# Patient Record
Sex: Female | Born: 1970 | Race: White | Hispanic: No | Smoking: Never smoker
Health system: Southern US, Community
[De-identification: ages and names within clinical notes are randomized; demographics above are authoritative.]

## PROBLEM LIST (undated history)

## (undated) DIAGNOSIS — I739 Peripheral vascular disease, unspecified: Secondary | ICD-10-CM

## (undated) DIAGNOSIS — R51 Headache: Secondary | ICD-10-CM

## (undated) DIAGNOSIS — R519 Headache, unspecified: Secondary | ICD-10-CM

## (undated) DIAGNOSIS — F419 Anxiety disorder, unspecified: Secondary | ICD-10-CM

## (undated) DIAGNOSIS — M1990 Unspecified osteoarthritis, unspecified site: Secondary | ICD-10-CM

## (undated) DIAGNOSIS — C50919 Malignant neoplasm of unspecified site of unspecified female breast: Secondary | ICD-10-CM

## (undated) DIAGNOSIS — E063 Autoimmune thyroiditis: Secondary | ICD-10-CM

## (undated) DIAGNOSIS — C801 Malignant (primary) neoplasm, unspecified: Secondary | ICD-10-CM

## (undated) DIAGNOSIS — J45909 Unspecified asthma, uncomplicated: Secondary | ICD-10-CM

## (undated) DIAGNOSIS — E559 Vitamin D deficiency, unspecified: Secondary | ICD-10-CM

## (undated) DIAGNOSIS — J189 Pneumonia, unspecified organism: Secondary | ICD-10-CM

## (undated) DIAGNOSIS — F40298 Other specified phobia: Secondary | ICD-10-CM

## (undated) DIAGNOSIS — I639 Cerebral infarction, unspecified: Secondary | ICD-10-CM

## (undated) HISTORY — DX: Anxiety disorder, unspecified: F41.9

## (undated) HISTORY — DX: Autoimmune thyroiditis: E06.3

## (undated) HISTORY — DX: Vitamin D deficiency, unspecified: E55.9

## (undated) HISTORY — DX: Other specified phobia: F40.298

## (undated) HISTORY — PX: VEIN LIGATION AND STRIPPING: SHX2653

---

## 1992-10-01 HISTORY — PX: TUBAL LIGATION: SHX77

## 1995-10-02 HISTORY — PX: ABDOMINAL HYSTERECTOMY: SHX81

## 2002-11-18 ENCOUNTER — Other Ambulatory Visit: Admission: RE | Admit: 2002-11-18 | Discharge: 2002-11-18 | Payer: Self-pay | Admitting: Obstetrics and Gynecology

## 2004-08-31 ENCOUNTER — Inpatient Hospital Stay (HOSPITAL_COMMUNITY): Admission: RE | Admit: 2004-08-31 | Discharge: 2004-09-03 | Payer: Self-pay | Admitting: Psychiatry

## 2004-08-31 ENCOUNTER — Ambulatory Visit: Payer: Self-pay | Admitting: Psychiatry

## 2004-10-01 DIAGNOSIS — J189 Pneumonia, unspecified organism: Secondary | ICD-10-CM

## 2004-10-01 HISTORY — DX: Pneumonia, unspecified organism: J18.9

## 2006-10-01 DIAGNOSIS — I639 Cerebral infarction, unspecified: Secondary | ICD-10-CM

## 2006-10-01 HISTORY — DX: Cerebral infarction, unspecified: I63.9

## 2014-05-25 ENCOUNTER — Other Ambulatory Visit: Payer: Self-pay | Admitting: Family Medicine

## 2014-05-25 DIAGNOSIS — N6310 Unspecified lump in the right breast, unspecified quadrant: Secondary | ICD-10-CM

## 2014-05-25 DIAGNOSIS — R922 Inconclusive mammogram: Secondary | ICD-10-CM

## 2014-05-28 ENCOUNTER — Other Ambulatory Visit: Payer: Self-pay | Admitting: Family Medicine

## 2014-05-28 ENCOUNTER — Ambulatory Visit
Admission: RE | Admit: 2014-05-28 | Discharge: 2014-05-28 | Disposition: A | Discharge status: Home / Self Care | Payer: BC Managed Care – PPO | Source: Ambulatory Visit | Attending: Family Medicine | Admitting: Family Medicine

## 2014-05-28 ENCOUNTER — Encounter (INDEPENDENT_AMBULATORY_CARE_PROVIDER_SITE_OTHER): Payer: Self-pay

## 2014-05-28 DIAGNOSIS — R922 Inconclusive mammogram: Secondary | ICD-10-CM

## 2014-05-28 DIAGNOSIS — N6310 Unspecified lump in the right breast, unspecified quadrant: Secondary | ICD-10-CM

## 2014-06-04 ENCOUNTER — Ambulatory Visit
Admission: RE | Admit: 2014-06-04 | Discharge: 2014-06-04 | Disposition: A | Discharge status: Home / Self Care | Payer: BC Managed Care – PPO | Source: Ambulatory Visit | Attending: Family Medicine | Admitting: Family Medicine

## 2014-06-04 DIAGNOSIS — R922 Inconclusive mammogram: Secondary | ICD-10-CM

## 2014-06-04 DIAGNOSIS — N6310 Unspecified lump in the right breast, unspecified quadrant: Secondary | ICD-10-CM

## 2014-06-08 ENCOUNTER — Other Ambulatory Visit: Payer: Self-pay | Admitting: Family Medicine

## 2014-06-08 DIAGNOSIS — C50911 Malignant neoplasm of unspecified site of right female breast: Secondary | ICD-10-CM

## 2014-06-10 ENCOUNTER — Telehealth: Payer: Self-pay | Admitting: *Deleted

## 2014-06-10 DIAGNOSIS — C50411 Malignant neoplasm of upper-outer quadrant of right female breast: Secondary | ICD-10-CM

## 2014-06-10 DIAGNOSIS — Z170 Estrogen receptor positive status [ER+]: Secondary | ICD-10-CM

## 2014-06-10 NOTE — Telephone Encounter (Signed)
Confirmed [facility] for 06/16/14 at 0800.  Instructions and contact information given.

## 2014-06-11 ENCOUNTER — Ambulatory Visit
Admission: RE | Admit: 2014-06-11 | Discharge: 2014-06-11 | Disposition: A | Discharge status: Home / Self Care | Payer: BC Managed Care – PPO | Source: Ambulatory Visit | Attending: Family Medicine | Admitting: Family Medicine

## 2014-06-11 DIAGNOSIS — C50911 Malignant neoplasm of unspecified site of right female breast: Secondary | ICD-10-CM

## 2014-06-11 MED ORDER — GADOBENATE DIMEGLUMINE 529 MG/ML IV SOLN
12.0000 mL | Freq: Once | INTRAVENOUS | Status: AC | PRN
  Administered 2014-06-11: 12 mL via INTRAVENOUS

## 2014-06-16 ENCOUNTER — Encounter: Payer: Self-pay | Admitting: Oncology

## 2014-06-16 ENCOUNTER — Telehealth: Payer: Self-pay | Admitting: Oncology

## 2014-06-16 ENCOUNTER — Ambulatory Visit
Admission: RE | Admit: 2014-06-16 | Discharge: 2014-06-16 | Disposition: A | Discharge status: Home / Self Care | Payer: BC Managed Care – PPO | Source: Ambulatory Visit | Attending: Radiation Oncology | Admitting: Radiation Oncology

## 2014-06-16 ENCOUNTER — Ambulatory Visit (HOSPITAL_BASED_OUTPATIENT_CLINIC_OR_DEPARTMENT_OTHER): Payer: BC Managed Care – PPO | Admitting: Oncology

## 2014-06-16 ENCOUNTER — Encounter: Payer: Self-pay | Admitting: *Deleted

## 2014-06-16 ENCOUNTER — Encounter: Payer: Self-pay | Admitting: Dietician

## 2014-06-16 ENCOUNTER — Ambulatory Visit: Payer: BC Managed Care – PPO | Attending: General Surgery | Admitting: Physical Therapy

## 2014-06-16 ENCOUNTER — Other Ambulatory Visit (HOSPITAL_BASED_OUTPATIENT_CLINIC_OR_DEPARTMENT_OTHER): Payer: BC Managed Care – PPO

## 2014-06-16 ENCOUNTER — Other Ambulatory Visit (INDEPENDENT_AMBULATORY_CARE_PROVIDER_SITE_OTHER): Payer: Self-pay | Admitting: General Surgery

## 2014-06-16 ENCOUNTER — Ambulatory Visit: Payer: BC Managed Care – PPO

## 2014-06-16 VITALS — BP 112/71 | HR 66 | Temp 98.3°F | Resp 20 | Ht 65.0 in | Wt 138.6 lb

## 2014-06-16 DIAGNOSIS — Z8673 Personal history of transient ischemic attack (TIA), and cerebral infarction without residual deficits: Secondary | ICD-10-CM

## 2014-06-16 DIAGNOSIS — C50419 Malignant neoplasm of upper-outer quadrant of unspecified female breast: Secondary | ICD-10-CM

## 2014-06-16 DIAGNOSIS — C50911 Malignant neoplasm of unspecified site of right female breast: Secondary | ICD-10-CM

## 2014-06-16 DIAGNOSIS — IMO0001 Reserved for inherently not codable concepts without codable children: Secondary | ICD-10-CM | POA: Diagnosis not present

## 2014-06-16 DIAGNOSIS — C50411 Malignant neoplasm of upper-outer quadrant of right female breast: Secondary | ICD-10-CM

## 2014-06-16 DIAGNOSIS — C50919 Malignant neoplasm of unspecified site of unspecified female breast: Secondary | ICD-10-CM | POA: Insufficient documentation

## 2014-06-16 DIAGNOSIS — E063 Autoimmune thyroiditis: Secondary | ICD-10-CM

## 2014-06-16 DIAGNOSIS — Z90710 Acquired absence of both cervix and uterus: Secondary | ICD-10-CM | POA: Insufficient documentation

## 2014-06-16 DIAGNOSIS — Z170 Estrogen receptor positive status [ER+]: Secondary | ICD-10-CM

## 2014-06-16 LAB — COMPREHENSIVE METABOLIC PANEL (CC13)
ALBUMIN: 4 g/dL (ref 3.5–5.0)
ALT: 14 U/L (ref 0–55)
ANION GAP: 10 meq/L (ref 3–11)
AST: 19 U/L (ref 5–34)
Alkaline Phosphatase: 63 U/L (ref 40–150)
BUN: 8 mg/dL (ref 7.0–26.0)
CALCIUM: 9 mg/dL (ref 8.4–10.4)
CO2: 25 meq/L (ref 22–29)
Chloride: 106 mEq/L (ref 98–109)
Creatinine: 0.8 mg/dL (ref 0.6–1.1)
GLUCOSE: 95 mg/dL (ref 70–140)
POTASSIUM: 4 meq/L (ref 3.5–5.1)
SODIUM: 141 meq/L (ref 136–145)
TOTAL PROTEIN: 7.3 g/dL (ref 6.4–8.3)
Total Bilirubin: 0.62 mg/dL (ref 0.20–1.20)

## 2014-06-16 LAB — CBC WITH DIFFERENTIAL/PLATELET
BASO%: 1.2 % (ref 0.0–2.0)
BASOS ABS: 0 10*3/uL (ref 0.0–0.1)
EOS ABS: 0.3 10*3/uL (ref 0.0–0.5)
EOS%: 9 % — ABNORMAL HIGH (ref 0.0–7.0)
HCT: 41.6 % (ref 34.8–46.6)
HGB: 14 g/dL (ref 11.6–15.9)
LYMPH%: 26 % (ref 14.0–49.7)
MCH: 30.6 pg (ref 25.1–34.0)
MCHC: 33.6 g/dL (ref 31.5–36.0)
MCV: 91 fL (ref 79.5–101.0)
MONO#: 0.4 10*3/uL (ref 0.1–0.9)
MONO%: 11.2 % (ref 0.0–14.0)
NEUT%: 52.6 % (ref 38.4–76.8)
NEUTROS ABS: 2 10*3/uL (ref 1.5–6.5)
PLATELETS: 177 10*3/uL (ref 145–400)
RBC: 4.57 10*6/uL (ref 3.70–5.45)
RDW: 12.1 % (ref 11.2–14.5)
WBC: 3.7 10*3/uL — ABNORMAL LOW (ref 3.9–10.3)
lymph#: 1 10*3/uL (ref 0.9–3.3)

## 2014-06-16 NOTE — Progress Notes (Signed)
[facility]  Telephone:([phone]) [phone] [phone]) [phone]     ID: Celese Banner DOB: [birth year]  MR#: [id number]  QXI#:[id number]  Patient Care Team: Kathyrn Lass, MD as PCP - General (Family Medicine) Excell Seltzer, MD as Consulting Physician (General Surgery) Chauncey Cruel, MD as Consulting Physician (Oncology) Thea Silversmith, MD as Consulting Physician (Radiation Oncology)  CHIEF COMPLAINT: Estrogen receptor positive breast cancer  CURRENT TREATMENT: Awaiting definitive surgery   BREAST CANCER HISTORY: "Candy" noted a change in her right breast sometime around February or March 2015. She waited to see whether it would resolve, but as it did not she eventually brought it to the attention of Dr. Sabra Heck, who confirmed the finding and set up the patient for bilateral diagnostic mammography and right breast ultrasonography at the [facility] 05/28/2014. Breast density was category D. There was an irregular mass with spiculations in the right breast upper-outer quadrant which was palpable and described as hard. Ultrasound confirmed an irregular spiculated hypoechoic mass occupying the entire upper outer quadrant of the right breast and measuring at least 4.5 cm. In the right axilla there was a lymph node which was normal in size but showed cortical nodularity and abnormal peripheral hypervascularity.  Accordingly on 06/04/2014 the patient underwent biopsy of the breast mass (the lymph node described from the prior ultrasound was found to have a large fatty hilum and symmetric cortex but a moderate size artery and vein draped over it and therefore it was not biopsied). 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  On 06/11/2014 the patient underwent bilateral breast MRI. This showed an area of architectural distortion an irregular clumped enhancement extending for a total of 7.4 cm in the right breast. He comes close to the skin of the anterior lateral right breast but no definite abnormal skin enhancement was noted. There were no findings suggestive of nipple involvement and no findings of the pectoralis involvement. The left breast and axillary lymph nodes are unremarkable. However there was a nonenhancing T2 increased signal along the left aspect of the lower thoracic vertebral body which was felt to require further evaluation.  The patient's subsequent history is as detailed below  INTERVAL HISTORY: Can be was evaluated in the multidisciplinary breast cancer clinic 06/16/2014 accompanied by her mother. Her case was also presented at the multidisciplinary breast cancer conference that same morning.  REVIEW OF SYSTEMS: Aside from the mass itself, candy has been experiencing a variety of symptoms which are disturbing to her. She tells me that she had been on Wellbutrin and Adderall at some point and was found to have "mini strokes" possibly secondary to those medications. She has numbness in her hands and feet, which is intermittent but fairly constant. This can be accompanied by tingling. She has severe migraine headaches, which "got better after my divorce" in 1999, but not occur at least once or twice a month and recently more frequently. Of course she has a history of asthma and stress urinary incontinence. She has a history of Hashimoto's thyroiditis which is currently not active. She sleeps poorly, has pain in her right breast, knees, chest, and in her right this to her lower molar. She complains of blurred vision. She is having some hearing loss. She has sinus problems. She sleeps  on 2 pillows. She bruises easily. She has some difficulty walking and complaints of leg weakness, which is very  intermittent. She feels forgetful and anxious. She has significant phobic concerns. A detailed review of systems today was otherwise stable  PAST MEDICAL HISTORY: Past Medical History  Diagnosis Date  . Hashimoto's thyroiditis   . Vitamin D deficiency   . Anxiety     PAST SURGICAL HISTORY: Past Surgical History  Procedure Laterality Date  . Abdominal hysterectomy    . Tubal ligation    . Vein ligation and stripping      FAMILY HISTORY No family history on file. The patient's father died from complications of pulmonary fibrosis at the age of 45. The patient's mother is living, age 55. The patient had one brother, no sisters. The patient's paternal grandmother had ovarian cancer. The patient's father had 3 sisters. 2 of them have been diagnosed with breast cancer, one in her 5s one in her 17s. The one in her 10s was tested for the BRCA genes and was negative.  GYNECOLOGIC HISTORY:  No LMP recorded. Menarche age 9, first live birth age 6. The patient is GX P2. She had a hysterectomy in 1997. She is not on hormone replacement.  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 She is divorced. At home she lives with her mother, Vauda Salvucci, who semiretired. She has worked in Scientist, research (medical). The patient's son Kimball Manske is currently 51 attends and she stated where he is a Ship broker. The patient's other son died at the age of 79. The patient has no grandchildren. She is not a Ambulance person.    ADVANCED DIRECTIVES: Not in place. At her 06/16/2014 visit the patient was given the appropriate documents to complete. She is considering naming her brother as her healthcare power of attorney.   HEALTH MAINTENANCE: History  Substance Use Topics  . Smoking status: Never Smoker   . Smokeless tobacco: Not on file  . Alcohol Use: No     Colonoscopy:  PAP:  Bone density:  Lipid panel:  Allergies not on file  No current  outpatient prescriptions on file.   No current facility-administered medications for this visit.    OBJECTIVE: Middle-aged white woman in no acute distress Filed Vitals:   06/16/14 0831  BP: 112/71  Pulse: 66  Temp: 98.3 F (36.8 C)  Resp: 20     Body mass index is 23.06 kg/(m^2).    ECOG FS:1 - Symptomatic but completely ambulatory  Ocular: Sclerae unicteric, pupils equal, round and reactive to light Ear-nose-throat: Oropharynx clear, dentition in fair repair Lymphatic: No cervical or supraclavicular adenopathy Lungs no rales or rhonchi, no wheezes  Heart regular rate and rhythm, no murmur appreciated Abd soft, nontender, positive bowel sounds MSK no focal spinal tenderness, no joint edema Neuro: non-focal, well-oriented, appropriate affect Breasts: The mass in the right breast is easily palpable. It is movable. By palpation it measures approximately 4 cm. There is no evidence of skin or nipple involvement. I do not palpate any right axillary masses. The left breast is unremarkable.   LAB RESULTS:  CMP     Component Value Date/Time   NA 141 06/16/2014 0810   K 4.0 06/16/2014 0810   CO2 25 06/16/2014 0810   GLUCOSE 95 06/16/2014 0810   BUN 8.0 06/16/2014 0810   CREATININE 0.8 06/16/2014 0810   CALCIUM 9.0 06/16/2014 0810   PROT 7.3 06/16/2014 0810  ALBUMIN 4.0 06/16/2014 0810   AST 19 06/16/2014 0810   ALT 14 06/16/2014 0810   ALKPHOS 63 06/16/2014 0810   BILITOT 0.62 06/16/2014 0810    I No results found for this basename: SPEP, UPEP,  kappa and lambda light chains    Lab Results  Component Value Date   WBC 3.7* 06/16/2014   NEUTROABS 2.0 06/16/2014   HGB 14.0 06/16/2014   HCT 41.6 06/16/2014   MCV 91.0 06/16/2014   PLT 177 06/16/2014      Chemistry      Component Value Date/Time   NA 141 06/16/2014 0810   K 4.0 06/16/2014 0810   CO2 25 06/16/2014 0810   BUN 8.0 06/16/2014 0810   CREATININE 0.8 06/16/2014 0810      Component Value Date/Time   CALCIUM 9.0 06/16/2014 0810    ALKPHOS 63 06/16/2014 0810   AST 19 06/16/2014 0810   ALT 14 06/16/2014 0810   BILITOT 0.62 06/16/2014 0810       No results found for this basename: LABCA2    No components found with this basename: LABCA125    No results found for this basename: INR,  in the last 168 hours  Urinalysis No results found for this basename: colorurine, appearanceur, labspec, phurine, glucoseu, hgbur, bilirubinur, ketonesur, proteinur, urobilinogen, nitrite, leukocytesur    STUDIES: Mr Breast Bilateral W Wo Contrast  06/11/2014   CLINICAL DATA:  43 year old female with recently diagnosed invasive mammary carcinoma of the right breast post ultrasound-guided biopsy of an irregular palpable mass at 10 o'clock.  LABS:  Not applicable.  EXAM: BILATERAL BREAST MRI WITH AND WITHOUT CONTRAST  TECHNIQUE: Multiplanar, multisequence MR images of both breasts were obtained prior to and following the intravenous administration of 53m of MultiHance.  THREE-DIMENSIONAL MR IMAGE RENDERING ON INDEPENDENT WORKSTATION:  Three-dimensional MR images were rendered by post-processing of the original MR data on an independent workstation. The three-dimensional MR images were interpreted, and findings are reported in the following complete MRI report for this study. Three dimensional images were evaluated at the independent DynaCad workstation  COMPARISON:  Previous exams  FINDINGS: Breast composition: c:  Heterogeneous fibroglandular tissue  Background parenchymal enhancement: Marked. Several small oval T2 bright nonenhancing masses are seen in the bilateral breasts compatible with small cysts. Motion is present on the post contrast images.  Right breast: Biopsy clip artifact is present in the upper-outer right breast at 10 o'clock at site of known biopsy proven malignancy with associated architectural distortion and an irregular linear clumped enhancing mass extending adjacent and posterior to the clip measuring approximately 7.4 cm AP,  2.8 cm transverse, and approximately 3 cm craniocaudal. Abnormal enhancement comes in close proximity to the skin of the anterior lateral right breast, although no definite abnormal skin enhancement seen. No findings to suggest nipple involvement. No findings of pectoralis muscle involvement.  Left breast: No suspicious rapidly enhancing masses or abnormal areas of enhancement in the left breast to suggest malignancy.  Lymph nodes: No morphologically abnormal axillary lymph nodes. No internal mammary lymphadenopathy seen.  Ancillary findings: Nonenhancing ovoid increased T2 signal along the peripheral left aspect of a lower thoracic vertebral body, indeterminate.  IMPRESSION: 1. Biopsy proven malignant the in the upper-outer right breast measures up to 7.4 cm AP. This comes within close proximity of the skin of the anterior lateral right breast, although no abnormal skin enhancement is seen.  2.  No MRI evidence of malignancy in the left breast.  3. Nonenhancing increased signal  involving a lower thoracic vertebral body is indeterminate/ incompletely evaluated on this exam. Consider further evaluation with a bone scan to evaluate for osseous metastatic disease.  RECOMMENDATION: Treatment plan for known right breast malignancy.  BI-RADS CATEGORY  6: Known biopsy-proven malignancy.   Electronically Signed   By: Everlean Alstrom M.D.   On: 06/11/2014 13:11   Mm Digital Diagnostic Bilat  05/28/2014   CLINICAL DATA:  Patient reports a questioned palpable mass right upper outer quadrant for 6 months  EXAM: DIGITAL DIAGNOSTIC  bilateral MAMMOGRAM WITH CAD  ULTRASOUND right BREAST  COMPARISON:  This is the patient's baseline exam  ACR Breast Density Category d: The breast tissue is extremely dense, which lowers the sensitivity of mammography.  FINDINGS: There is an irregular mass with peripheral spiculations and overlying skin indentation in the right upper outer quadrant, corresponding to the screening mammographic  finding. No abnormality is identified in the left breast.  Mammographic images were processed with CAD.  On physical exam, I palpate a hard mass in the right breast upper outer quadrant.  Ultrasound is performed, showing an irregular spiculated hypoechoic shadowing mass occupying nearly the entire upper-outer quadrant of the right breast 10 o'clock location 3 cm from the nipple, extending subjacent to the right nipple, measuring 4.5 by 3.8 by 1.3 cm. This corresponds to the palpable mass. In the right axilla, there is a lymph node which is normal in size but demonstrates cortical nodularity and abnormal peripheral hypervascularity.  IMPRESSION: Highly suspicious right breast mass 10 o'clock location with abnormal right axillary lymph node cortical nodularity and hypervascularity. Ultrasound-guided core biopsy of the dominant right breast mass and right axillary lymph node is recommended and has been scheduled 06/04/2014.  RECOMMENDATION: Right breast and axilla ultrasound-guided core biopsy  I have discussed the findings and recommendations with the patient. Results were also provided in writing at the conclusion of the visit. If applicable, a reminder letter will be sent to the patient regarding the next appointment.  BI-RADS CATEGORY  5: Highly suggestive of malignancy.   Electronically Signed   By: Conchita Paris M.D.   On: 05/28/2014 15:07   Mm Digital Diagnostic Unilat R  06/04/2014   CLINICAL DATA:  43 year old female -evaluate clip placement following ultrasound-guided right breast biopsy.  EXAM: DIAGNOSTIC RIGHT MAMMOGRAM POST ULTRASOUND BIOPSY  COMPARISON:  05/28/2014 and prior mammograms  FINDINGS: Mammographic images were obtained following ultrasound guided biopsy of 4.5 cm mass in the upper-outer right breast.  The coil shaped clip is in satisfactory position.  IMPRESSION: Satisfactory clip position following ultrasound-guided right breast biopsy.  Final Assessment: Post Procedure Mammograms for  Marker Placement   Electronically Signed   By: Hassan Rowan M.D.   On: 06/04/2014 14:15   US Breast Ltd Uni Right Inc Axilla  05/28/2014   CLINICAL DATA:  Patient reports a questioned palpable mass right upper outer quadrant for 6 months  EXAM: DIGITAL DIAGNOSTIC  bilateral MAMMOGRAM WITH CAD  ULTRASOUND right BREAST  COMPARISON:  This is the patient's baseline exam  ACR Breast Density Category d: The breast tissue is extremely dense, which lowers the sensitivity of mammography.  FINDINGS: There is an irregular mass with peripheral spiculations and overlying skin indentation in the right upper outer quadrant, corresponding to the screening mammographic finding. No abnormality is identified in the left breast.  Mammographic images were processed with CAD.  On physical exam, I palpate a hard mass in the right breast upper outer quadrant.  Ultrasound is performed, showing  an irregular spiculated hypoechoic shadowing mass occupying nearly the entire upper-outer quadrant of the right breast 10 o'clock location 3 cm from the nipple, extending subjacent to the right nipple, measuring 4.5 by 3.8 by 1.3 cm. This corresponds to the palpable mass. In the right axilla, there is a lymph node which is normal in size but demonstrates cortical nodularity and abnormal peripheral hypervascularity.  IMPRESSION: Highly suspicious right breast mass 10 o'clock location with abnormal right axillary lymph node cortical nodularity and hypervascularity. Ultrasound-guided core biopsy of the dominant right breast mass and right axillary lymph node is recommended and has been scheduled 06/04/2014.  RECOMMENDATION: Right breast and axilla ultrasound-guided core biopsy  I have discussed the findings and recommendations with the patient. Results were also provided in writing at the conclusion of the visit. If applicable, a reminder letter will be sent to the patient regarding the next appointment.  BI-RADS CATEGORY  5: Highly suggestive of  malignancy.   Electronically Signed   By: Conchita Paris M.D.   On: 05/28/2014 15:07   [place] Rt Breast Bx W Loc Dev 1st Lesion Img Bx Spec US Guide  06/08/2014   ADDENDUM REPORT: 06/08/2014 15:11  ADDENDUM: Pathology: Invasive mammary carcinoma, mammary carcinoma in situ  Pathology concordance with imaging findings: Yes  Recommendation: The patient is scheduled to have an MRI on 06/11/2014. She will be seen in multidisciplinary clinic on 06/16/2014.  At the request of the patient, I spoke with her by telephone on 06/08/2014 at 13:30. She reports that the biopsy site is healing well.   Electronically Signed   By: Pamelia Hoit M.D.   On: 06/08/2014 15:11   06/08/2014   CLINICAL DATA:  43 year old female for tissue sampling of right breast mass.  EXAM: ULTRASOUND GUIDED RIGHT BREAST CORE NEEDLE BIOPSY  COMPARISON:  Previous exams.  FINDINGS: Ultrasound evaluation again demonstrates the irregular hypoechoic mass at the 10 o'clock position of the right breast.  Several normal appearing right axillary lymph nodes are identified. The questioned lymph node on the prior report has a large fatty hilum and relatively symmetric cortex measuring up to 2 mm but also has a moderate sized artery and vein draped over this node. Given the fact that this lymph node does not appear particularly suspicious and high likelihood of bleeding risk, the right axillary lymph node biopsy was not performed.  I met with the patient and we discussed the procedure of ultrasound-guided biopsy, including benefits and alternatives. We discussed the high likelihood of a successful procedure. We discussed the risks of the procedure, including infection, bleeding, tissue injury, clip migration, and inadequate sampling. Informed written consent was given. The usual time-out protocol was performed immediately prior to the procedure.  Using sterile technique and 2% Lidocaine as local anesthetic, under direct ultrasound visualization, a 12 gauge spring-loaded  device was used to perform biopsy of the irregular 4.5 cm mass at the 10 o'clock position of the right breast using a lateral approach. At the conclusion of the procedure a coil shaped tissue marker clip was deployed into the biopsy cavity. Follow up 2 view mammogram was performed and dictated separately.  IMPRESSION: Ultrasound guided biopsy of right breast mass. No apparent complications.  Please note that the right lymph node biopsy was not performed as discussed above.  Pathology will be followed.  Electronically Signed: By: Hassan Rowan M.D. On: 06/04/2014 14:14    ASSESSMENT: 43 y.o. [place] woman status post right breast upper-outer quadrant biopsy 06/04/2014 for a clinical T3  N0-1, stage II/III invasive ductal carcinoma, grade 2, estrogen receptor 91% positive, progesterone receptor 58% positive, with an MIB-1 of 9%, and no HER-2 amplification  (1) genetics testing pending  (2) Oncotype results to be obtained if node negative at surgery  PLAN: We spent the better part of today's hour-long appointment discussing the biology of breast cancer in general, and the specifics of the patient's tumor in particular. Candy understands the difference between local treatment and systemic treatment. As far as local treatment is concerned, we feel it will be very difficult for her to avoid a mastectomy on the right. It is not clear at this point whether she will want bilateral mastectomies regardless of heart genetics results, but she does need to be tested for the BRCA gene mutations. She understands that even if she is BRCA positive it is possible to keep the contralateral breast, by doing that only yearly mammography but yearly MRIs. However most patients in that situation do opt for bilateral mastectomies because of the significant risk of a new breast cancer developing in the contralateral breast.  The patient understands she will need postmastectomy radiation. At this point she is not expressed an interest  in reconstruction  The systemic therapy questions are also complex. Certainly she will benefit from antiestrogen, and that is the most effective therapy for her cancer. She will get no benefit from an anti-HER-2 treatment.  The chemotherapy question is more complex. We do not see enlarged lymph nodes on the MRI, but her chance of having a positive lymph node and sentinel lymph node biopsy is done is very high because of the size of her tumor. If she does have positive lymph nodes, then she will need chemotherapy. If the lymph nodes are negative however we will send an Oncotype. She understands that may tell us that she will not benefit from chemotherapy or that she absolutely needs chemotherapy. If the results fall in the intermediate range my bias in this young woman would be to proceed to chemotherapy but that will require longer discussion.  She does need to be completely staged and I am setting her up for a brain MRI as well as a PET scan. She is going to see me in approximately 5 weeks. By then we should have all the data necessary to make the chemotherapy decision.  The patient has a good understanding of the overall plan. She agrees with it. She knows the goal of treatment in her case is cure. She will call with any problems that may develop before her next visit here.  Chauncey Cruel, MD   06/16/2014 9:53 AM

## 2014-06-16 NOTE — Telephone Encounter (Signed)
PER POF TO [facility] PT APPT-aNEE DID OVERRIDE FOR 60 MIN APPT 11/5-pt here for [facility] will give pt copy of sch

## 2014-06-16 NOTE — Progress Notes (Unsigned)
Patient was seen by RD during [facility] on 06/16/2014  Provided pt with folder of educational materials regarding general nutrition recommendations for breast cancer patients, plant-based diets, antioxidants, cancer facts vs myths, and information on organic foods  Explained importance of healthy nutrition during treatments and encouraged pt to consume daily recommended amount of fruits and vegetables, emphasizing variety of intake for maximum antioxidant and synergistic health benefits. Promoted adequate fiber intake, with use of whole grain and whole wheat products, beans, and lentils. Encouraged patient to follow a low fat diet with use of heart healthy fats, and to opt for plant-based proteins weekly  Recommended pt maintain healthy weight during treatments, and encouraged gradual weight loss as warranted after procedures.  Diet recall indicated pt consumes largely vegan diet, with the exception of tuna and fish intake. Takes Cat's Claw for immune support. Pt reported to have done a lot of nutrition research after her diagnosis and was appreciative of additional information. Noted her appetite has decreased slightly recently; however pt attributes this to her recent decreased physical activity levels  Patient had questions regarding soy and breast cancer. Pt does enjoy eating tofu and consuming soy milk, but had decreased intake with her diagnosis as she was weary of the interactions. Reviewed soy and breast cancer nutrition handout, and recommended a conservative intake of 1-2 servings daily to assist pt in meeting protein needs  Expect excellent compliance. Pt has great interest and is very aware and compliant with appropriate diet recommendations.  Provided pt with outpatient oncology RD contact information. Encouraged pt to contact RD with additional follow up questions or nutrition-related concerns.  Atlee Abide MS RD LDN Clinical Dietitian [phone]

## 2014-06-16 NOTE — Addendum Note (Signed)
Addended by: Rockwell Germany on: 06/16/2014 10:38 AM   Modules accepted: Orders

## 2014-06-16 NOTE — Progress Notes (Signed)
Checked in new pt with no financial concerns at this time.  I informed pt of the [employer] and gave her a flyer to show what they assist with.  I also informed pt of the different foundations that offer copay assistance w/ chemo if needed and I will also obtain authorization for chemo if req.  She has my card for any future questions or concerns and also if she would like to apply for the [employer].

## 2014-06-16 NOTE — Progress Notes (Signed)
I saw the patient today. She has a palpable large mass in the right breast measuring 7.4 cm by MRI with negative lymph nodes.  There are also findings concerning for metastatic diase.  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  She is interested in mastectomy. We discussed post mastectomy radiation for tumors > 5 cm after mastectomy.  I will meet back with her to discuss radiation in greater detail .

## 2014-06-16 NOTE — Progress Notes (Signed)
[place] Psychosocial Distress Screening Clinical Social Work  Patient completed distress screening protocol and scored a 7 on the Psychosocial Distress Thermometer which indicates moderate distress. Clinical Social Worker met with patient and patients mother in [facility] to assess for distress and other psychosocial needs.  While patient did state she was still experiencing some anxiety associated with her diagnosis, her level of distress had decreased after meeting with the treatment team.  CSW offered patient support and discussed the importance of emotional support during treatment.  CSW provided patient with information on the support team and support services at [facility].  Patient expressed interest in young womens support group and was agreeable to an [employer] referral.  CSW provided contact information and encouraged patient to call with any questions or concerns.         Clinical Social Worker follow up needed: Yes.    If yes, follow up plan: [place] [facility] Referral   [place] 06/16/2014  Screening Type Initial Screening  Mark the number that describes how much distress you have been experiencing in the past week 7  Practical problem type Insurance;Work/school  Emotional problem type Nervousness/Anxiety;Adjusting to illness  Spiritual/Religous concerns type Facing my mortality  Physical Problem type Pain;Getting around;Tingling hands/feet  Physician notified of physical symptoms Yes  Referral to clinical psychology No  Referral to clinical social work Yes  Referral to dietition No  Referral to financial advocate No  Referral to support programs Yes  Referral to palliative care No   Johnnye Lana, MSW, LCSW, OSW-C Clinical Social Worker [place] [phone]

## 2014-06-17 ENCOUNTER — Other Ambulatory Visit: Payer: BC Managed Care – PPO

## 2014-06-17 ENCOUNTER — Ambulatory Visit (HOSPITAL_BASED_OUTPATIENT_CLINIC_OR_DEPARTMENT_OTHER): Payer: BC Managed Care – PPO | Admitting: Genetic Counselor

## 2014-06-17 ENCOUNTER — Encounter: Payer: Self-pay | Admitting: *Deleted

## 2014-06-17 DIAGNOSIS — IMO0002 Reserved for concepts with insufficient information to code with codable children: Secondary | ICD-10-CM

## 2014-06-17 DIAGNOSIS — C50919 Malignant neoplasm of unspecified site of unspecified female breast: Secondary | ICD-10-CM | POA: Insufficient documentation

## 2014-06-17 DIAGNOSIS — Z801 Family history of malignant neoplasm of trachea, bronchus and lung: Secondary | ICD-10-CM

## 2014-06-17 DIAGNOSIS — Z8041 Family history of malignant neoplasm of ovary: Secondary | ICD-10-CM

## 2014-06-17 DIAGNOSIS — C50419 Malignant neoplasm of upper-outer quadrant of unspecified female breast: Secondary | ICD-10-CM

## 2014-06-17 DIAGNOSIS — Z803 Family history of malignant neoplasm of breast: Secondary | ICD-10-CM

## 2014-06-17 NOTE — Progress Notes (Signed)
HISTORY OF PRESENT ILLNESS: Dr. Excell Seltzer requested a cancer genetics consultation for [facility], a 43 y.o. female, due to a personal and family history of cancer.  Ms. Pol presents to clinic today to discuss the possibility of a hereditary predisposition to cancer, genetic testing, and to further clarify her future cancer risks, as well as potential cancer risk for family members. Ms. Leiner was recently diagnosed with right breast cancer at the age of 25 and is in the process of planning treatment. She would like to use genetic test results for surgical treatment decisions. She has had a TAH but both ovaries remain intact. She has no history of other cancer.   Past Medical History  Diagnosis Date   Hashimoto's thyroiditis    Vitamin D deficiency    Anxiety    Other isolated or specific phobias     Past Surgical History  Procedure Laterality Date   Abdominal hysterectomy     Tubal ligation     Vein ligation and stripping     History   Social History   Marital Status: Divorced    Spouse Name: N/A    Number of Children: N/A   Years of Education: N/A   Social History Main Topics   Smoking status: Never Smoker    Smokeless tobacco: Not on file   Alcohol Use: No   Drug Use: No   Sexual Activity: Not on file   Other Topics Concern   Not on file   Social History Narrative   No narrative on file     FAMILY HISTORY:  During the visit, a 4-generation pedigree was obtained. Significant diagnoses include the following:  Family History  Problem Relation Age of Onset   Breast cancer Paternal Aunt 59   Ovarian cancer Paternal Grandmother 26   Lung cancer Paternal Grandfather 68   Breast cancer Paternal Aunt 60    reportedly BRCA negative.     Ms. Mira ancestry is of Caucasian descent. There is no known Jewish ancestry or consanguinity.  GENETIC COUNSELING ASSESSMENT: Ms. Figler is a 43 y.o. female with a personal and family history of cancer suggestive  of a hereditary predisposition to cancer. We, therefore, discussed and recommended the following at today's visit.   DISCUSSION: We reviewed the characteristics, features and inheritance patterns of hereditary cancer syndromes. We also discussed genetic testing, including the appropriate family members to test, the process of testing, insurance coverage and turn-around-time for results. We discussed the implications of a negative, positive and/or variant of uncertain significant result. We recommended Ms. Capshaw pursue genetic testing for the BRCA1 and BRCA2 genes. If this testing is negative, we recommended testing for the OvaNext gene panel.   PLAN: Based on our above recommendation, Ms. Santerre wished to pursue genetic testing and the blood sample was drawn and will be sent to [facility] for analysis. Results for the BRCA1 and BRCA2 genes should be available within approximately 2 weeks time, at which point they will be disclosed by telephone to Ms. Danko, as will any additional recommendations warranted by these results. If necessary for the gene panel to then be pursued, these results will take an additional 2 weeks and will be disclosed once available.We also encouraged Ms. Fermin to remain in contact with cancer genetics annually so that we can continuously update the family history and inform her of any changes in cancer genetics and testing that may be of benefit for this family. Ms.  Corredor questions were answered to her satisfaction  today. Our contact information was provided should additional questions or concerns arise.   Thank you for the referral and allowing [place] to share in the care of your patient.   The patient was seen for a total of 40 minutes in face-to-face genetic counseling.  This patient was discussed with Magrinat who agrees with the above.    _______________________________________________________________________ For Office Staff:  Number of people involved in  session: 2 Was an Intern/ student involved with case: not applicable

## 2014-06-17 NOTE — Progress Notes (Signed)
[place] Social Work  Clinical Social Work was referred by pt  to review and complete healthcare advance directives.  Clinical Social Worker met with patient and her mother in [place] office.  The patient designated Taylor Perez as their primary healthcare agent and Charon Smedberg as their secondary agent.  Patient also completed healthcare living will.    Clinical Social Worker notarized documents and made copies for patient/family. Clinical Social Worker will send documents to medical records to be scanned into patient's chart. Clinical Social Worker encouraged patient/family to contact with any additional questions or concerns.  Loren Racer, [place] Worker [place]  [place] Phone: [phone] Fax: [phone]

## 2014-06-19 ENCOUNTER — Encounter (HOSPITAL_COMMUNITY): Payer: Self-pay

## 2014-06-19 ENCOUNTER — Encounter (HOSPITAL_COMMUNITY)
Admission: RE | Admit: 2014-06-19 | Discharge: 2014-06-19 | Disposition: A | Discharge status: Home / Self Care | Payer: BC Managed Care – PPO | Source: Ambulatory Visit | Attending: Oncology | Admitting: Oncology

## 2014-06-19 DIAGNOSIS — C50419 Malignant neoplasm of upper-outer quadrant of unspecified female breast: Secondary | ICD-10-CM | POA: Diagnosis present

## 2014-06-19 DIAGNOSIS — C50411 Malignant neoplasm of upper-outer quadrant of right female breast: Secondary | ICD-10-CM

## 2014-06-19 LAB — GLUCOSE, CAPILLARY: GLUCOSE-CAPILLARY: 85 mg/dL (ref 70–99)

## 2014-06-19 MED ORDER — FLUDEOXYGLUCOSE F - 18 (FDG) INJECTION: 7.5000 | Freq: Once | INTRAVENOUS | Status: AC | PRN

## 2014-06-21 ENCOUNTER — Telehealth: Payer: Self-pay | Admitting: *Deleted

## 2014-06-21 ENCOUNTER — Other Ambulatory Visit: Payer: Self-pay | Admitting: Oncology

## 2014-06-21 NOTE — Telephone Encounter (Signed)
Spoke with patient from [facility] 06/16/14.  She informed me that she went for her PET scan on 06/19/14. When her test was complete the technologist asked her is she had an appointment to see Dr. Jana Hakim on Monday and she told him now she will see him in about 4 weeks after surgery.  She states he then told her then she should go home and have a cocktail.  Patient was very upset and she had feelings before this that her cancer had spread to her brain and other areas.  Informed patient I would report this to Dr. Jana Hakim and to [facility].  Safety Zone Portal completed.  I informed her that as soon as the PET scan results were ready I would have Dr. Jana Hakim to review and give her a call.  Offered support and encouraged her to call with any needs.

## 2014-06-25 ENCOUNTER — Ambulatory Visit (HOSPITAL_COMMUNITY)
Admission: RE | Admit: 2014-06-25 | Discharge: 2014-06-25 | Disposition: A | Discharge status: Home / Self Care | Payer: BC Managed Care – PPO | Source: Ambulatory Visit | Attending: Oncology | Admitting: Oncology

## 2014-06-25 DIAGNOSIS — C50419 Malignant neoplasm of upper-outer quadrant of unspecified female breast: Secondary | ICD-10-CM | POA: Diagnosis not present

## 2014-06-25 DIAGNOSIS — R51 Headache: Secondary | ICD-10-CM | POA: Insufficient documentation

## 2014-06-25 DIAGNOSIS — C50411 Malignant neoplasm of upper-outer quadrant of right female breast: Secondary | ICD-10-CM

## 2014-06-25 MED ORDER — GADOBENATE DIMEGLUMINE 529 MG/ML IV SOLN
12.0000 mL | Freq: Once | INTRAVENOUS | Status: AC | PRN
  Administered 2014-06-25: 12 mL via INTRAVENOUS

## 2014-06-30 ENCOUNTER — Encounter: Payer: Self-pay | Admitting: Genetic Counselor

## 2014-06-30 DIAGNOSIS — Z8041 Family history of malignant neoplasm of ovary: Secondary | ICD-10-CM

## 2014-06-30 DIAGNOSIS — C50919 Malignant neoplasm of unspecified site of unspecified female breast: Secondary | ICD-10-CM

## 2014-06-30 DIAGNOSIS — Z803 Family history of malignant neoplasm of breast: Secondary | ICD-10-CM

## 2014-06-30 NOTE — Progress Notes (Signed)
Taylor Perez recently had cancer genetic counseling at [facility] on June 17, 2014. At that time, it was recommended she pursue genetic testing. Her BRCA1 and BRCA2 gene test, which was performed at [facility], has returned and is negative for mutations. These results were disclosed to her today. Per her request, reflex testing for the OvaNext gene panel at [facility] was initiated. Results for the gene panel should be available in 2-3 more weeks and we will contact her to discuss these results and recommendations warranted by these results.

## 2014-07-01 ENCOUNTER — Other Ambulatory Visit: Payer: Self-pay | Admitting: Oncology

## 2014-07-01 ENCOUNTER — Encounter (HOSPITAL_COMMUNITY): Payer: Self-pay

## 2014-07-07 NOTE — Pre-Procedure Instructions (Signed)
Taylor Perez  07/07/2014   Your procedure is scheduled on:  Friday October 16 th at 1100 AM  Report to [facility] Admitting at 0900 AM.  Call this number if you have problems the morning of surgery: [phone]   Remember:   Do not eat food or drink liquids after midnight.   Take these medicines the morning of surgery with A SIP OF WATER: Use and bring inhaler day of surgery.  Stop all vitamins,minerals, herbal medications, Aspirin, Krill oil, and Nsaids 5 days prior to surgery.    Do not wear jewelry, make-up or nail polish.  Do not wear lotions, powders, or perfumes. You may wear deodorant.  Do not shave 48 hours prior to surgery.   Do not bring valuables to the hospital.  [facility] is not responsible  for any belongings or valuables.               Contacts, dentures or bridgework may not be worn into surgery.  Leave suitcase in the car. After surgery it may be brought to your room.  For patients admitted to the hospital, discharge time is determined by your treatment team.               Patients discharged the day of surgery will not be allowed to drive home.    Special Instructions: [place] - Preparing for Surgery  Before surgery, you can play an important role.  Because skin is not sterile, your skin needs to be as free of germs as possible.  You can reduce the number of germs on you skin by washing with CHG (chlorahexidine gluconate) soap before surgery.  CHG is an antiseptic cleaner which kills germs and bonds with the skin to continue killing germs even after washing.  Please DO NOT use if you have an allergy to CHG or antibacterial soaps.  If your skin becomes reddened/irritated stop using the CHG and inform your nurse when you arrive at Short Stay.  Do not shave (including legs and underarms) for at least 48 hours prior to the first CHG shower.  You may shave your face.  Please follow these instructions carefully:   1.  Shower with CHG Soap the night  before surgery and the                                morning of Surgery.  2.  If you choose to wash your hair, wash your hair first as usual with your       normal shampoo.  3.  After you shampoo, rinse your hair and body thoroughly to remove the                      Shampoo.  4.  Use CHG as you would any other liquid soap.  You can apply chg directly       to the skin and wash gently with scrungie or a clean washcloth.  5.  Apply the CHG Soap to your body ONLY FROM THE NECK DOWN.        Do not use on open wounds or open sores.  Avoid contact with your eyes,       ears, mouth and genitals (private parts).  Wash genitals (private parts)       with your normal soap.  6.  Wash thoroughly, paying special attention to the area where your surgery  will be performed.  7.  Thoroughly rinse your body with warm water from the neck down.  8.  DO NOT shower/wash with your normal soap after using and rinsing off       the CHG Soap.  9.  Pat yourself dry with a clean towel.            10.  Wear clean pajamas.            11.  Place clean sheets on your bed the night of your first shower and do not        sleep with pets.  Day of Surgery  Do not apply any lotions/deoderants the morning of surgery.  Please wear clean clothes to the hospital/surgery center.      Please read over the following fact sheets that you were given: Pain Booklet, Coughing and Deep Breathing and Surgical Site Infection Prevention

## 2014-07-08 ENCOUNTER — Encounter (HOSPITAL_COMMUNITY): Payer: Self-pay

## 2014-07-08 ENCOUNTER — Telehealth: Payer: Self-pay | Admitting: *Deleted

## 2014-07-08 ENCOUNTER — Encounter (HOSPITAL_COMMUNITY)
Admission: RE | Admit: 2014-07-08 | Discharge: 2014-07-08 | Disposition: A | Discharge status: Home / Self Care | Payer: BC Managed Care – PPO | Source: Ambulatory Visit | Attending: General Surgery | Admitting: General Surgery

## 2014-07-08 ENCOUNTER — Ambulatory Visit (HOSPITAL_COMMUNITY)
Admission: RE | Admit: 2014-07-08 | Discharge: 2014-07-08 | Disposition: A | Discharge status: Home / Self Care | Payer: BC Managed Care – PPO | Source: Ambulatory Visit | Attending: Anesthesiology | Admitting: Anesthesiology

## 2014-07-08 DIAGNOSIS — Z01818 Encounter for other preprocedural examination: Secondary | ICD-10-CM | POA: Diagnosis not present

## 2014-07-08 DIAGNOSIS — R001 Bradycardia, unspecified: Secondary | ICD-10-CM | POA: Insufficient documentation

## 2014-07-08 DIAGNOSIS — I739 Peripheral vascular disease, unspecified: Secondary | ICD-10-CM | POA: Diagnosis not present

## 2014-07-08 DIAGNOSIS — C50411 Malignant neoplasm of upper-outer quadrant of right female breast: Secondary | ICD-10-CM | POA: Insufficient documentation

## 2014-07-08 DIAGNOSIS — Z8673 Personal history of transient ischemic attack (TIA), and cerebral infarction without residual deficits: Secondary | ICD-10-CM | POA: Insufficient documentation

## 2014-07-08 DIAGNOSIS — Z9011 Acquired absence of right breast and nipple: Secondary | ICD-10-CM | POA: Insufficient documentation

## 2014-07-08 HISTORY — DX: Headache: R51

## 2014-07-08 HISTORY — DX: Peripheral vascular disease, unspecified: I73.9

## 2014-07-08 HISTORY — DX: Headache, unspecified: R51.9

## 2014-07-08 HISTORY — DX: Unspecified osteoarthritis, unspecified site: M19.90

## 2014-07-08 HISTORY — DX: Cerebral infarction, unspecified: I63.9

## 2014-07-08 HISTORY — DX: Pneumonia, unspecified organism: J18.9

## 2014-07-08 HISTORY — DX: Unspecified asthma, uncomplicated: J45.909

## 2014-07-08 HISTORY — DX: Malignant (primary) neoplasm, unspecified: C80.1

## 2014-07-08 LAB — BASIC METABOLIC PANEL
Anion gap: 9 (ref 5–15)
BUN: 8 mg/dL (ref 6–23)
CO2: 25 mEq/L (ref 19–32)
CREATININE: 0.8 mg/dL (ref 0.50–1.10)
Calcium: 9 mg/dL (ref 8.4–10.5)
Chloride: 106 mEq/L (ref 96–112)
GFR calc Af Amer: >90 mL/min (ref >90)
GFR calc non Af Amer: 88 mL/min — ABNORMAL LOW (ref >90)
GLUCOSE: 88 mg/dL (ref 70–99)
Potassium: 4.4 mEq/L (ref 3.7–5.3)
SODIUM: 140 meq/L (ref 137–147)

## 2014-07-08 LAB — CBC
HEMATOCRIT: 40 % (ref 36.0–46.0)
Hemoglobin: 13.8 g/dL (ref 12.0–15.0)
MCH: 30.3 pg (ref 26.0–34.0)
MCHC: 34.5 g/dL (ref 30.0–36.0)
MCV: 87.7 fL (ref 78.0–100.0)
Platelets: 169 10*3/uL (ref 150–400)
RBC: 4.56 MIL/uL (ref 3.87–5.11)
RDW: 11.9 % (ref 11.5–15.5)
WBC: 2.7 10*3/uL — AB (ref 4.0–10.5)

## 2014-07-08 MED ORDER — CHLORHEXIDINE GLUCONATE 4 % EX LIQD: 1.0000 "application " | Freq: Once | CUTANEOUS | Status: DC

## 2014-07-08 NOTE — Telephone Encounter (Signed)
Per Dr. Jana Hakim, call results of MRI to patient: no progression seen on scan. Patient voices understanding and appreciated call.

## 2014-07-09 NOTE — Progress Notes (Signed)
Anesthesia Chart Review:  Pt is 43 year old female scheduled for R total mastectomy with R axillary sentinel lymph node biopsy, possible axillary dissection on 07/16/14 with Dr. Excell Seltzer.   PMH: Stroke ("mini stroke" 2008), Hashimoto's thyroiditis, PVD, asthma, breast cancer, anxiety  Medications include: albuterol, vitamins.   Preoperative labs reviewed.  WBC 2.7.   Chest x-ray reviewed. No active cardiopulmonary disease.  EKG: Sinus bradycardia Possible Left atrial enlargement Cannot rule out Anterior infarct , age undetermined No previous tracing  No CV symptoms documented in PAT. If no changes, I anticipate pt can proceed with surgery as scheduled.   Willeen Cass, FNP-BC [facility] Short Stay Surgical Center/Anesthesiology Phone: [phone] 07/09/2014 2:43 PM

## 2014-07-14 ENCOUNTER — Other Ambulatory Visit (INDEPENDENT_AMBULATORY_CARE_PROVIDER_SITE_OTHER): Payer: Self-pay | Admitting: General Surgery

## 2014-07-15 MED ORDER — CEFAZOLIN SODIUM-DEXTROSE 2-3 GM-% IV SOLR
2.0000 g | INTRAVENOUS | Status: AC
  Administered 2014-07-16: 2 g via INTRAVENOUS
  Filled 2014-07-15: qty 50

## 2014-07-15 NOTE — Progress Notes (Signed)
Pt verbalize understanding arrival time 8am on 10/16

## 2014-07-16 ENCOUNTER — Encounter (HOSPITAL_COMMUNITY): Payer: BC Managed Care – PPO | Admitting: Emergency Medicine

## 2014-07-16 ENCOUNTER — Ambulatory Visit (HOSPITAL_COMMUNITY): Payer: BC Managed Care – PPO | Admitting: Certified Registered Nurse Anesthetist

## 2014-07-16 ENCOUNTER — Encounter (HOSPITAL_COMMUNITY)
Admission: RE | Admit: 2014-07-16 | Discharge: 2014-07-16 | Disposition: A | Discharge status: Home / Self Care | Payer: BC Managed Care – PPO | Source: Ambulatory Visit | Attending: General Surgery | Admitting: General Surgery

## 2014-07-16 ENCOUNTER — Encounter (HOSPITAL_COMMUNITY)
Admission: RE | Disposition: A | Discharge status: Home / Self Care | Payer: Self-pay | Source: Ambulatory Visit | Attending: General Surgery

## 2014-07-16 ENCOUNTER — Encounter (HOSPITAL_COMMUNITY): Payer: Self-pay | Admitting: *Deleted

## 2014-07-16 ENCOUNTER — Observation Stay (HOSPITAL_COMMUNITY)
Admission: RE | Admit: 2014-07-16 | Discharge: 2014-07-17 | Disposition: A | Discharge status: Home / Self Care | Payer: BC Managed Care – PPO | Source: Ambulatory Visit | Attending: General Surgery | Admitting: General Surgery

## 2014-07-16 DIAGNOSIS — Z170 Estrogen receptor positive status [ER+]: Secondary | ICD-10-CM | POA: Insufficient documentation

## 2014-07-16 DIAGNOSIS — Z4001 Encounter for prophylactic removal of breast: Secondary | ICD-10-CM | POA: Insufficient documentation

## 2014-07-16 DIAGNOSIS — F419 Anxiety disorder, unspecified: Secondary | ICD-10-CM | POA: Diagnosis not present

## 2014-07-16 DIAGNOSIS — C50411 Malignant neoplasm of upper-outer quadrant of right female breast: Principal | ICD-10-CM | POA: Insufficient documentation

## 2014-07-16 DIAGNOSIS — J45909 Unspecified asthma, uncomplicated: Secondary | ICD-10-CM | POA: Diagnosis not present

## 2014-07-16 DIAGNOSIS — C50919 Malignant neoplasm of unspecified site of unspecified female breast: Secondary | ICD-10-CM | POA: Diagnosis present

## 2014-07-16 DIAGNOSIS — C50911 Malignant neoplasm of unspecified site of right female breast: Secondary | ICD-10-CM

## 2014-07-16 DIAGNOSIS — I739 Peripheral vascular disease, unspecified: Secondary | ICD-10-CM | POA: Insufficient documentation

## 2014-07-16 HISTORY — PX: BILATERAL TOTAL MASTECTOMY WITH AXILLARY LYMPH NODE DISSECTION: SHX6364

## 2014-07-16 HISTORY — PX: TOTAL MASTECTOMY: SHX6129

## 2014-07-16 SURGERY — BILATERAL TOTAL MASTECTOMY WITH AXILLARY LYMPH NODE DISSECTION
Anesthesia: General | Site: Breast | Laterality: Bilateral

## 2014-07-16 MED ORDER — GLYCOPYRROLATE 0.2 MG/ML IJ SOLN
INTRAMUSCULAR | Status: AC
  Filled 2014-07-16: qty 3

## 2014-07-16 MED ORDER — OXYCODONE HCL 5 MG/5ML PO SOLN: 5.0000 mg | Freq: Once | ORAL | Status: DC | PRN

## 2014-07-16 MED ORDER — METHYLENE BLUE 1 % INJ SOLN
INTRAMUSCULAR | Status: AC
  Filled 2014-07-16: qty 10

## 2014-07-16 MED ORDER — CHLORHEXIDINE GLUCONATE 4 % EX LIQD: 1.0000 "application " | Freq: Once | CUTANEOUS | Status: DC

## 2014-07-16 MED ORDER — ONDANSETRON HCL 4 MG/2ML IJ SOLN
INTRAMUSCULAR | Status: AC
  Filled 2014-07-16: qty 2

## 2014-07-16 MED ORDER — DIPHENHYDRAMINE HCL 50 MG/ML IJ SOLN
INTRAMUSCULAR | Status: DC | PRN
  Administered 2014-07-16: 12.5 mg via INTRAVENOUS

## 2014-07-16 MED ORDER — FENTANYL CITRATE 0.05 MG/ML IJ SOLN
INTRAMUSCULAR | Status: AC
  Filled 2014-07-16: qty 5

## 2014-07-16 MED ORDER — SODIUM CHLORIDE 0.9 % IJ SOLN
INTRAMUSCULAR | Status: DC | PRN
  Administered 2014-07-16: 11:00:00 via INTRAMUSCULAR

## 2014-07-16 MED ORDER — FENTANYL CITRATE 0.05 MG/ML IJ SOLN
INTRAMUSCULAR | Status: AC
  Administered 2014-07-16: 100 ug via INTRAVENOUS
  Filled 2014-07-16: qty 2

## 2014-07-16 MED ORDER — ONDANSETRON HCL 4 MG/2ML IJ SOLN
INTRAMUSCULAR | Status: DC | PRN
  Administered 2014-07-16: 4 mg via INTRAVENOUS

## 2014-07-16 MED ORDER — PROMETHAZINE HCL 25 MG/ML IJ SOLN: 6.2500 mg | INTRAMUSCULAR | Status: DC | PRN

## 2014-07-16 MED ORDER — KCL IN DEXTROSE-NACL 20-5-0.9 MEQ/L-%-% IV SOLN
INTRAVENOUS | Status: DC
  Administered 2014-07-16 – 2014-07-17 (×2): via INTRAVENOUS
  Filled 2014-07-16 (×3): qty 1000

## 2014-07-16 MED ORDER — MIDAZOLAM HCL 5 MG/ML IJ SOLN: 2.0000 mg | Freq: Once | INTRAMUSCULAR | Status: DC

## 2014-07-16 MED ORDER — NEOSTIGMINE METHYLSULFATE 10 MG/10ML IV SOLN
INTRAVENOUS | Status: AC
  Filled 2014-07-16: qty 1

## 2014-07-16 MED ORDER — LACTATED RINGERS IV SOLN
INTRAVENOUS | Status: DC | PRN
  Administered 2014-07-16 (×2): via INTRAVENOUS

## 2014-07-16 MED ORDER — PROPOFOL 10 MG/ML IV BOLUS
INTRAVENOUS | Status: DC | PRN
  Administered 2014-07-16: 170 mg via INTRAVENOUS

## 2014-07-16 MED ORDER — SODIUM CHLORIDE 0.9 % IJ SOLN
INTRAMUSCULAR | Status: AC
  Filled 2014-07-16: qty 10

## 2014-07-16 MED ORDER — DIPHENHYDRAMINE HCL 50 MG/ML IJ SOLN
INTRAMUSCULAR | Status: AC
  Filled 2014-07-16: qty 1

## 2014-07-16 MED ORDER — LACTATED RINGERS IV SOLN
INTRAVENOUS | Status: DC
  Administered 2014-07-16: 09:00:00 via INTRAVENOUS

## 2014-07-16 MED ORDER — SCOPOLAMINE 1 MG/3DAYS TD PT72
MEDICATED_PATCH | TRANSDERMAL | Status: AC
  Administered 2014-07-16: 1.5 mg via TRANSDERMAL
  Filled 2014-07-16: qty 1

## 2014-07-16 MED ORDER — 0.9 % SODIUM CHLORIDE (POUR BTL) OPTIME
TOPICAL | Status: DC | PRN
  Administered 2014-07-16 (×2): 1000 mL

## 2014-07-16 MED ORDER — HEPARIN SODIUM (PORCINE) 5000 UNIT/ML IJ SOLN
5000.0000 [IU] | Freq: Three times a day (TID) | INTRAMUSCULAR | Status: DC
  Filled 2014-07-16 (×4): qty 1

## 2014-07-16 MED ORDER — DEXAMETHASONE SODIUM PHOSPHATE 4 MG/ML IJ SOLN
INTRAMUSCULAR | Status: AC
  Filled 2014-07-16: qty 1

## 2014-07-16 MED ORDER — FENTANYL CITRATE 0.05 MG/ML IJ SOLN
INTRAMUSCULAR | Status: DC | PRN
  Administered 2014-07-16: 50 ug via INTRAVENOUS
  Administered 2014-07-16 (×2): 25 ug via INTRAVENOUS
  Administered 2014-07-16 (×4): 50 ug via INTRAVENOUS
  Administered 2014-07-16: 100 ug via INTRAVENOUS

## 2014-07-16 MED ORDER — ROCURONIUM BROMIDE 50 MG/5ML IV SOLN
INTRAVENOUS | Status: AC
  Filled 2014-07-16: qty 1

## 2014-07-16 MED ORDER — HYDROMORPHONE HCL 1 MG/ML IJ SOLN
INTRAMUSCULAR | Status: AC
  Filled 2014-07-16: qty 1

## 2014-07-16 MED ORDER — ALBUTEROL SULFATE (2.5 MG/3ML) 0.083% IN NEBU: 3.0000 mL | INHALATION_SOLUTION | Freq: Four times a day (QID) | RESPIRATORY_TRACT | Status: DC | PRN

## 2014-07-16 MED ORDER — SCOPOLAMINE 1 MG/3DAYS TD PT72
1.0000 | MEDICATED_PATCH | TRANSDERMAL | Status: DC
  Administered 2014-07-16: 1.5 mg via TRANSDERMAL

## 2014-07-16 MED ORDER — TECHNETIUM TC 99M SULFUR COLLOID FILTERED: 1.0000 | Freq: Once | INTRAVENOUS | Status: AC | PRN

## 2014-07-16 MED ORDER — PROPOFOL 10 MG/ML IV BOLUS
INTRAVENOUS | Status: AC
  Filled 2014-07-16: qty 20

## 2014-07-16 MED ORDER — OXYCODONE HCL 5 MG PO TABS: 5.0000 mg | ORAL_TABLET | Freq: Once | ORAL | Status: DC | PRN

## 2014-07-16 MED ORDER — PHENYLEPHRINE HCL 10 MG/ML IJ SOLN
INTRAMUSCULAR | Status: DC | PRN
  Administered 2014-07-16 (×2): 40 ug via INTRAVENOUS

## 2014-07-16 MED ORDER — DEXAMETHASONE SODIUM PHOSPHATE 4 MG/ML IJ SOLN
INTRAMUSCULAR | Status: DC | PRN
  Administered 2014-07-16: 4 mg via INTRAVENOUS

## 2014-07-16 MED ORDER — LIDOCAINE HCL (CARDIAC) 20 MG/ML IV SOLN
INTRAVENOUS | Status: DC | PRN
Start: 2014-07-16 — End: 2014-07-16
  Administered 2014-07-16: 40 mg via INTRAVENOUS

## 2014-07-16 MED ORDER — HYDROMORPHONE HCL 1 MG/ML IJ SOLN: 0.2500 mg | INTRAMUSCULAR | Status: DC | PRN

## 2014-07-16 MED ORDER — MIDAZOLAM HCL 2 MG/2ML IJ SOLN
INTRAMUSCULAR | Status: AC
  Filled 2014-07-16: qty 2

## 2014-07-16 MED ORDER — MORPHINE SULFATE 2 MG/ML IJ SOLN
2.0000 mg | INTRAMUSCULAR | Status: DC | PRN
  Administered 2014-07-16: 2 mg via INTRAVENOUS
  Filled 2014-07-16: qty 1

## 2014-07-16 MED ORDER — MIDAZOLAM HCL 2 MG/2ML IJ SOLN
INTRAMUSCULAR | Status: AC
  Administered 2014-07-16: 2 mg
  Filled 2014-07-16: qty 2

## 2014-07-16 MED ORDER — FENTANYL CITRATE 0.05 MG/ML IJ SOLN
100.0000 ug | Freq: Once | INTRAMUSCULAR | Status: AC
  Administered 2014-07-16: 100 ug via INTRAVENOUS

## 2014-07-16 MED ORDER — MIDAZOLAM HCL 5 MG/5ML IJ SOLN
INTRAMUSCULAR | Status: DC | PRN
  Administered 2014-07-16: 2 mg via INTRAVENOUS

## 2014-07-16 MED ORDER — ARTIFICIAL TEARS OP OINT
TOPICAL_OINTMENT | OPHTHALMIC | Status: AC
  Filled 2014-07-16: qty 3.5

## 2014-07-16 MED ORDER — ONDANSETRON HCL 4 MG PO TABS: 4.0000 mg | ORAL_TABLET | Freq: Four times a day (QID) | ORAL | Status: DC | PRN

## 2014-07-16 MED ORDER — ONDANSETRON HCL 4 MG/2ML IJ SOLN: 4.0000 mg | Freq: Four times a day (QID) | INTRAMUSCULAR | Status: DC | PRN

## 2014-07-16 MED ORDER — HYDROMORPHONE HCL 1 MG/ML IJ SOLN
0.2500 mg | INTRAMUSCULAR | Status: DC | PRN
  Administered 2014-07-16 (×3): 0.25 mg via INTRAVENOUS

## 2014-07-16 MED ORDER — OXYCODONE-ACETAMINOPHEN 5-325 MG PO TABS
1.0000 | ORAL_TABLET | ORAL | Status: DC | PRN
  Administered 2014-07-16 – 2014-07-17 (×3): 2 via ORAL
  Filled 2014-07-16 (×3): qty 2

## 2014-07-16 SURGICAL SUPPLY — 51 items
ADH SKN CLS APL DERMABOND .7 (GAUZE/BANDAGES/DRESSINGS) ×1
BINDER BREAST LRG (GAUZE/BANDAGES/DRESSINGS) ×2 IMPLANT
BINDER BREAST XLRG (GAUZE/BANDAGES/DRESSINGS) IMPLANT
CANISTER SUCTION 2500CC (MISCELLANEOUS) ×6 IMPLANT
CHLORAPREP W/TINT 26ML (MISCELLANEOUS) ×3 IMPLANT
CLIP TI MEDIUM 6 (CLIP) ×3 IMPLANT
CONT SPEC 4OZ CLIKSEAL STRL BL (MISCELLANEOUS) ×5 IMPLANT
COVER PROBE W GEL 5X96 (DRAPES) ×3 IMPLANT
COVER SURGICAL LIGHT HANDLE (MISCELLANEOUS) ×3 IMPLANT
DERMABOND ADVANCED (GAUZE/BANDAGES/DRESSINGS) ×2
DERMABOND ADVANCED .7 DNX12 (GAUZE/BANDAGES/DRESSINGS) ×1 IMPLANT
DEVICE DISSECT PLASMABLAD 3.0S (MISCELLANEOUS) ×1 IMPLANT
DRAIN CHANNEL 19F RND (DRAIN) ×5 IMPLANT
DRAPE CHEST BREAST 15X10 FENES (DRAPES) ×3 IMPLANT
DRAPE UTILITY 15X26 W/TAPE STR (DRAPE) ×4 IMPLANT
ELECT CAUTERY BLADE 6.4 (BLADE) ×3 IMPLANT
ELECT REM PT RETURN 9FT ADLT (ELECTROSURGICAL) ×3
ELECTRODE REM PT RTRN 9FT ADLT (ELECTROSURGICAL) ×2 IMPLANT
EVACUATOR SILICONE 100CC (DRAIN) ×5 IMPLANT
GLOVE BIOGEL PI IND STRL 8 (GLOVE) ×1 IMPLANT
GLOVE BIOGEL PI INDICATOR 8 (GLOVE) ×2
GLOVE SS BIOGEL STRL SZ 7.5 (GLOVE) ×1 IMPLANT
GLOVE SUPERSENSE BIOGEL SZ 7.5 (GLOVE) ×2
GOWN STRL REUS W/ TWL LRG LVL3 (GOWN DISPOSABLE) ×2 IMPLANT
GOWN STRL REUS W/ TWL XL LVL3 (GOWN DISPOSABLE) ×1 IMPLANT
GOWN STRL REUS W/TWL LRG LVL3 (GOWN DISPOSABLE) ×3
GOWN STRL REUS W/TWL XL LVL3 (GOWN DISPOSABLE) ×9
KIT BASIN OR (CUSTOM PROCEDURE TRAY) ×3 IMPLANT
KIT ROOM TURNOVER OR (KITS) ×3 IMPLANT
NDL 18GX1X1/2 (RX/OR ONLY) (NEEDLE) ×1 IMPLANT
NDL HYPO 25GX1X1/2 BEV (NEEDLE) ×1 IMPLANT
NEEDLE 18GX1X1/2 (RX/OR ONLY) (NEEDLE) ×3 IMPLANT
NEEDLE HYPO 25GX1X1/2 BEV (NEEDLE) ×3 IMPLANT
NS IRRIG 1000ML POUR BTL (IV SOLUTION) ×5 IMPLANT
PACK GENERAL/GYN (CUSTOM PROCEDURE TRAY) ×3 IMPLANT
PAD ABD 8X10 STRL (GAUZE/BANDAGES/DRESSINGS) ×4 IMPLANT
PAD ARMBOARD 7.5X6 YLW CONV (MISCELLANEOUS) ×3 IMPLANT
PLASMABLADE 3.0S (MISCELLANEOUS) ×3
SPECIMEN JAR X LARGE (MISCELLANEOUS) ×5 IMPLANT
SPONGE GAUZE 4X4 12PLY STER LF (GAUZE/BANDAGES/DRESSINGS) ×4 IMPLANT
SUT ETHILON 2 0 FS 18 (SUTURE) ×3 IMPLANT
SUT MNCRL AB 4-0 PS2 18 (SUTURE) ×4 IMPLANT
SUT MON AB 5-0 PS2 18 (SUTURE) ×1 IMPLANT
SUT VIC AB 3-0 SH 18 (SUTURE) ×7 IMPLANT
SYR CONTROL 10ML LL (SYRINGE) ×3 IMPLANT
TAPE CLOTH SURG 4X10 WHT LF (GAUZE/BANDAGES/DRESSINGS) ×2 IMPLANT
TAPE STRIPS DRAPE STRL (GAUZE/BANDAGES/DRESSINGS) ×2 IMPLANT
TOWEL OR 17X24 6PK STRL BLUE (TOWEL DISPOSABLE) ×3 IMPLANT
TOWEL OR 17X26 10 PK STRL BLUE (TOWEL DISPOSABLE) ×3 IMPLANT
TUBE CONNECTING 12'X1/4 (SUCTIONS) ×1
TUBE CONNECTING 12X1/4 (SUCTIONS) ×2 IMPLANT

## 2014-07-16 NOTE — Anesthesia Postprocedure Evaluation (Signed)
Anesthesia Post Note  Patient: Public librarian  Procedure(s) Performed: Procedure(s) (LRB): BILATERAL TOTAL  MASTECTOMY WITH RIGHT  AXILLARY SENTINEL NODE BIOPSY, PROPHYLATIC LEFT BREAST Mastectomy (Bilateral)  Anesthesia type: general  Patient location: PACU  Post pain: Pain level controlled  Post assessment: Patient's Cardiovascular Status Stable  Last Vitals:  Filed Vitals:   07/16/14 1351  BP: 111/64  Pulse: 69  Temp: 36.9 C  Resp: 13    Post vital signs: Reviewed and stable  Level of consciousness: sedated  Complications: No apparent anesthesia complications

## 2014-07-16 NOTE — Op Note (Signed)
Preoperative Diagnosis: cancer right breast  Postoprative Diagnosis: cancer right breast  Procedure: Procedure(s): BILATERAL TOTAL  MASTECTOMY WITH RIGHT BLUE DYE INJECTION,  AXILLARY SENTINEL NODE BIOPSY, PROPHYLATIC LEFT BREAST Mastectomy   Surgeon: Excell Seltzer T   Assistants: None  Anesthesia:  General LMA anesthesia  Indications: Patient is a 43 year old female who presents with a large right breast cancer, T3 N0. We discussed treatment options detailed extensively elsewhere and she is like to proceed with right total mastectomy with axillary sentinel lymph node biopsy and possible axillary dissection. She has a strong family history of breast cancer and although genetically negative Desires left prophylactic mastectomy. She is brought to the operating room for these procedures.    Procedure Detail:  Patient was brought to the operating room, placed in supine position on the operating table, and general LMA anesthesia was induced. The right breast was prepped and after patient amount was performed I injected 5 cc of dilute methylene blue subcutaneously beneath the right nipple and massaged this for several minutes. She previously undergone injection of 1 mCi of technetium sulfur colloid intradermally around the right nipple in the holding area. Following this the entire anterior chest wall and breasts and upper arms and neck were widely sterilely prepped and draped. She received preoperative IV antibiotics. Patient Timeout was again performed. The left prophylactic side was approached initially. I made a elliptical curvilinear incision encompassing the nipple areolar complex. The plasma blade was used. The skin and subcutaneous flaps were then raised superiorly toward the clavicle, medially to the edge of the sternum, inferiorly to the inframammary crease and laterally out to the anterior border of latissimus dorsi. The breast was then reflected up off the chest wall beginning superior  medially dissecting off the pectoralis fashion working laterally and freeing the specimen off the edge of the pectoralis major and the serratus muscle and then laterally out from attachments to the latissimus dissecting up toward the axilla until the specimen was localized to the junction of the low axillary fat.  It came across the tissue at this area between [facility] clamps and the specimen was removed and oriented and sent for permanent pathology. The stitch was tied with 3-0 Vicryl. The wound was thoroughly irrigated with saline and complete hemostasis assured. A 19 Blake round drain was placed through a separate stab wound under both flaps. The subcutaneous tissue was closed with interrupted 3-0 Vicryl and the skin with subcuticular 4-0 Monocryl and Dermabond. Following this attention was turned to the right side. An identical incision and flap dissection was performed. The axilla was exposed and using the neoprobe for guidance and using careful blunt dissection I was able to identify 2 nodes with very high counts and bright blue dye, one with counts of about 2500 and another with counts of 500. These were completely excised with cautery. Background in the axilla at this point was essentially 0. There was no palpable adenopathy. These were sent for immediate touch prep diagnosis. While awaiting this result the breast was reflected up off the chest wall identically to the other side dissecting up to the low axilla and again coming across this with clamps and tied with 3-0 Vicryl. The specimen was sent for permanent pathology oriented. The port of the lymph nodes returned as negative for malignancy. This wound on the right side was closed in an identical fashion. Sponge needle and instrument counts were correct. The patient was taken to recovery in good condition.    Findings: As above  Estimated  Blood Loss:  less than 50 mL         Drains: #19 round Blake drain x2, one on each side  Blood Given: none           Specimens: #1 left total mastectomy  #2 right total mastectomy   #3 hot blue right axillary sentinel lymph node     #4 hot blue right axillary sentinel lymph node        Complications:  * No complications entered in OR log *         Disposition: PACU - hemodynamically stable.         Condition: stable

## 2014-07-16 NOTE — Anesthesia Procedure Notes (Addendum)
Anesthesia Regional Block:  Pectoralis block  Pre-Anesthetic Checklist: ,, timeout performed, Correct Patient, Correct Site, Correct Laterality, Correct Procedure, Correct Position, site marked, Risks and benefits discussed,  Surgical consent,  Pre-op evaluation,  At surgeon's request and post-op pain management  Laterality: Right  Prep: chloraprep       Needles:  Injection technique: Single-shot  Needle Type: Echogenic Stimulator Needle     Needle Length: 10cm 10 cm Needle Gauge: 21 and 21 G    Additional Needles:  Procedures: ultrasound guided (picture in chart) Pectoralis block Narrative:  Start time: 07/16/2014 9:22 AM End time: 07/16/2014 9:32 AM Injection made incrementally with aspirations every 5 mL.  Performed by: Personally    Procedure Name: LMA Insertion Date/Time: 07/16/2014 10:19 AM Performed by: Trixie Deis A Pre-anesthesia Checklist: Patient identified, Timeout performed, Emergency Drugs available, Suction available and Patient being monitored Patient Re-evaluated:Patient Re-evaluated prior to inductionOxygen Delivery Method: Circle system utilized Preoxygenation: Pre-oxygenation with 100% oxygen Intubation Type: IV induction Ventilation: Mask ventilation without difficulty LMA: LMA inserted LMA Size: 4.0 Number of attempts: 1 Placement Confirmation: breath sounds checked- equal and bilateral and positive ETCO2 Tube secured with: Tape Dental Injury: Teeth and Oropharynx as per pre-operative assessment

## 2014-07-16 NOTE — H&P (Signed)
History of Present Illness Taylor Perez T. Dewain Platz MD; 06/16/2014 11:18 AM) The patient is a 43 year old female who presents with breast cancer. Patint is a a 43 year old pre-menopausal female referred by Dr. Margarette Canada for evaluation of recently diagnosed carcinoma of the right breast. she initially felt a mass in her right breast approximately 6 months ago. She recently presented to her family physician for evaluation and was referred to the breast center for further evaluation. Subsequent imaging included diagnostic mamogram showing an irregular mass with peripheral spiculations and overlying skin indentation in the upper outer quadrant of the right breast. and ultrasound showing a hypoechoic mass occupying the essentially entire upper outer quadrant of the right breast measuring 4.5 x 3.8 x 1.3 cm.. An ultrasound guided breast biopsy was performed on 06/04/2014 with pathology revealing invasive ductal carcinoma of the breast. subsequent bilateral breast MRI showed a large area of clumped mass and enhancement measuring up to 7.4 cm in AP dimensionin the area of the known malignancy. No MRI evidence of left breast malignancy. There was an increasedthat was felt to be indet thoracic vertebral body that was felt to be indeterminate. She is seen now in [facility] for initial treatment planning. she denies any significant change in the mass over the last few months. No skin changes or nipple discharge.. She does not have a personal history of any previous breast problems. Findings at that time were the following: Tumor size: 7.5 cm Tumor grade: 2 Estrogen Receptor: positive Progesterone Receptor: positive Her-2 neu: negative Lymph node status: negative   Other Problems Jake Shark, IT; 06/16/2014 8:51 AM) Asthma Bladder Problems Chest pain Lump In Breast Migraine Headache  Past Surgical History Jake Shark, IT; 06/16/2014 8:51 AM) Hysterectomy (not due to cancer) - Partial Oral  Surgery  Diagnostic Studies History Jake Shark, IT; 06/16/2014 8:51 AM) Colonoscopy never Mammogram within last year Pap Smear >5 years ago  Social History Jake Shark, IT; 06/16/2014 8:51 AM) Alcohol use Moderate alcohol use. Caffeine use Coffee. No drug use Tobacco use Never smoker.  Family History Jake Shark, IT; 06/16/2014 8:51 AM) Alcohol Abuse Father. Depression Mother. Diabetes Mellitus Father. Hypertension Father.  Pregnancy / Birth History Jake Shark, IT; 06/16/2014 8:51 AM) Age at menarche 33 years. Gravida 2 Maternal age 79-25 Para 2  Review of Systems Jake Shark IT; 06/16/2014 8:51 AM) General Present- Appetite Loss, Chills and Fatigue. Not Present- Fever, Night Sweats, Weight Gain and Weight Loss. Skin Not Present- Change in Wart/Mole, Dryness, Hives, Jaundice, New Lesions, Non-Healing Wounds, Rash and Ulcer. HEENT Present- Seasonal Allergies. Not Present- Earache, Hearing Loss, Hoarseness, Nose Bleed, Oral Ulcers, Ringing in the Ears, Sinus Pain, Sore Throat, Visual Disturbances, Wears glasses/contact lenses and Yellow Eyes. Respiratory Not Present- Bloody sputum, Chronic Cough, Difficulty Breathing, Snoring and Wheezing. Breast Present- Breast Mass and Breast Pain. Not Present- Nipple Discharge and Skin Changes. Cardiovascular Present- Chest Pain and Leg Cramps. Not Present- Difficulty Breathing Lying Down, Palpitations, Rapid Heart Rate, Shortness of Breath and Swelling of Extremities. Gastrointestinal Present- Bloating. Not Present- Abdominal Pain, Bloody Stool, Change in Bowel Habits, Chronic diarrhea, Constipation, Difficulty Swallowing, Excessive gas, Gets full quickly at meals, Hemorrhoids, Indigestion, Nausea, Rectal Pain and Vomiting. Female Genitourinary Not Present- Frequency, Nocturia, Painful Urination, Pelvic Pain and Urgency. Musculoskeletal Present- Muscle Pain and Swelling of Extremities. Not Present- Back Pain, Joint Pain,  Joint Stiffness and Muscle Weakness. Neurological Present- Decreased Memory, Headaches, Numbness and Tingling. Not Present- Fainting, Seizures, Tremor, Trouble walking and Weakness. Psychiatric Present- Anxiety. Not  Present- Bipolar, Change in Sleep Pattern, Depression, Fearful and Frequent crying. Endocrine Not Present- Cold Intolerance, Excessive Hunger, Hair Changes, Heat Intolerance, Hot flashes and New Diabetes. Hematology Present- Easy Bruising. Not Present- Excessive bleeding, Gland problems, HIV and Persistent Infections.   Physical Exam Taylor Perez T. Asmar Brozek MD; 06/16/2014 11:19 AM) The physical exam findings are as follows: Note:General: Alert, well-developed and well nourished Caucasian female, in no distress Skin: Warm and dry without rash or infection. HEENT: No palpable masses or thyromegaly. Sclera nonicteric. Pupils equal round and reactive. Oropharynx clear. Lymph nodes: No cervical, supraclavicular, or inguinal nodes palpable. Breasts: There is a large indistinct but fairly soft mass involving a large portion of the upper outer quadrant of the right breast. No skin changes or nipple inversion. Left breast is negative to exam. No palpable axillary adenopathy. Lungs: Breath sounds clear and equal. No wheezing or increased work of breathing. Cardiovascular: Regular rate and rhythm without murmer. No JVD or edema. Abdomen: Nondistended. Soft and nontender. No masses palpable. No organomegaly. No palpable hernias. Extremities: No edema or joint swelling or deformity. No chronic venous stasis changes. Neurologic: Alert and fully oriented. Gait normal. No focal weakness. Psychiatric: Normal mood and affect. Thought content appropriate with normal judgement and insight    Assessment & Plan Taylor Perez T. Terrick Allred MD; 06/16/2014 11:27 AM) BREAST CANCER, RIGHT (174.9  C50.911) Impression: 43 year old female with a new diagnosis of cancer of the right breast, pper outer quadrant.  Clinical stage T3 N0 IIB, ER positive, PR positive, HER-2 negative. I discussed with the patient and family members present today initial surgical treatment options. We discussed options of breast conservation with lumpectomy or total mastectomy and sentinal lymph node biopsy/dissection. Options for reconstruction were discussed. due to the size of her tumorextensively through the upper outer quadrant of the breast I believe that a total mastectomy with axillary sentinel lymph node biopsy as her best choice. she is in complete agreement with this and comfortable with the plan. She does not desire reconstruction at this time. Genetic testing is planned and she possibly would want contralateral prophylactic mastectomy if she is genetically positive.. We discussed the indications and nature of the procedure, and expected recovery, in detail. Surgical risks including anesthetic complications, cardiorespiratory complications, bleeding, infection, wound healing complications, blood clots, lymphedema, local and distant recurrence and possible need for further surgery based on the final pathology was discussed and understood. Chemotherapy, hormonal therapy and radiation therapy have been discussed. They have been provided with literature regarding the treatment of breast cancer. All questions were answered. They understand and agree to proceed and we will go ahead with scheduling. Current Plans  Referred to Genetic Counseling, for evaluation and follow up (Medical Genetics). Schedule for Surgery plan right total mastectomy with axillary sentinel lymph node biopsy and possible axillary dissection. She desires prophyllactic left total mastectomy which is reasonable given her age and family history

## 2014-07-16 NOTE — Anesthesia Preprocedure Evaluation (Addendum)
Anesthesia Evaluation  Patient identified by MRN, date of birth, ID band Patient awake    Reviewed: Allergy & Precautions, H&P , NPO status , Patient's Chart, lab work & pertinent test results  History of Anesthesia Complications Negative for: history of anesthetic complications  Airway Mallampati: II TM Distance: >3 FB Neck ROM: Full    Dental  (+) Teeth Intact, Dental Advisory Given   Pulmonary asthma ,    Pulmonary exam normal       Cardiovascular + Peripheral Vascular Disease     Neuro/Psych PSYCHIATRIC DISORDERS Anxiety Symptoms related to psy meds    GI/Hepatic negative GI ROS, Neg liver ROS,   Endo/Other  negative endocrine ROS  Renal/GU negative Renal ROS     Musculoskeletal   Abdominal   Peds  Hematology   Anesthesia Other Findings   Reproductive/Obstetrics                           Anesthesia Physical Anesthesia Plan  ASA: II  Anesthesia Plan: General   Post-op Pain Management:    Induction: Intravenous  Airway Management Planned: LMA  Additional Equipment:   Intra-op Plan:   Post-operative Plan: Extubation in OR  Informed Consent: I have reviewed the patients History and Physical, chart, labs and discussed the procedure including the risks, benefits and alternatives for the proposed anesthesia with the patient or authorized representative who has indicated his/her understanding and acceptance.   Dental advisory given  Plan Discussed with: CRNA, Anesthesiologist and Surgeon  Anesthesia Plan Comments:         Anesthesia Quick Evaluation

## 2014-07-16 NOTE — Transfer of Care (Signed)
Immediate Anesthesia Transfer of Care Note  Patient: Public librarian  Procedure(s) Performed: Procedure(s): BILATERAL TOTAL  MASTECTOMY WITH RIGHT  AXILLARY SENTINEL NODE BIOPSY, PROPHYLATIC LEFT BREAST Mastectomy (Bilateral)  Patient Location: PACU  Anesthesia Type:General  Level of Consciousness: awake, alert  and oriented  Airway & Oxygen Therapy: Patient Spontanous Breathing and Patient connected to nasal cannula oxygen  Post-op Assessment: Report given to PACU RN, Post -op Vital signs reviewed and stable and Patient moving all extremities  Post vital signs: Reviewed and stable  Complications: No apparent anesthesia complications

## 2014-07-17 DIAGNOSIS — C50411 Malignant neoplasm of upper-outer quadrant of right female breast: Secondary | ICD-10-CM | POA: Diagnosis not present

## 2014-07-17 LAB — CBC
HEMATOCRIT: 23.2 % — AB (ref 36.0–46.0)
HEMATOCRIT: 32.9 % — AB (ref 36.0–46.0)
HEMOGLOBIN: 11.3 g/dL — AB (ref 12.0–15.0)
Hemoglobin: 6.8 g/dL — CL (ref 12.0–15.0)
MCH: 30.5 pg (ref 26.0–34.0)
MCH: 31.1 pg (ref 26.0–34.0)
MCHC: 29.3 g/dL — ABNORMAL LOW (ref 30.0–36.0)
MCHC: 34.3 g/dL (ref 30.0–36.0)
MCV: 104 fL — AB (ref 78.0–100.0)
MCV: 90.6 fL (ref 78.0–100.0)
Platelets: 86 10*3/uL — ABNORMAL LOW (ref 150–400)
Platelets: 144 10*3/uL — ABNORMAL LOW (ref 150–400)
RBC: 2.23 MIL/uL — AB (ref 3.87–5.11)
RBC: 3.63 MIL/uL — ABNORMAL LOW (ref 3.87–5.11)
RDW: 14 % (ref 11.5–15.5)
RDW: 12 % (ref 11.5–15.5)
WBC: 4.1 10*3/uL (ref 4.0–10.5)
WBC: 6 10*3/uL (ref 4.0–10.5)

## 2014-07-17 LAB — BASIC METABOLIC PANEL
Anion gap: 9 (ref 5–15)
BUN: 6 mg/dL (ref 6–23)
CHLORIDE: 104 meq/L (ref 96–112)
CO2: 23 meq/L (ref 19–32)
Calcium: 8.1 mg/dL — ABNORMAL LOW (ref 8.4–10.5)
Creatinine, Ser: 0.72 mg/dL (ref 0.50–1.10)
GFR calc non Af Amer: >90 mL/min (ref >90)
Glucose, Bld: 107 mg/dL — ABNORMAL HIGH (ref 70–99)
Potassium: 4 mEq/L (ref 3.7–5.3)
Sodium: 136 mEq/L — ABNORMAL LOW (ref 137–147)

## 2014-07-17 LAB — TYPE AND SCREEN
ABO/RH(D): B POS
Antibody Screen: NEGATIVE

## 2014-07-17 LAB — ABO/RH: ABO/RH(D): B POS

## 2014-07-17 MED ORDER — HYDROCODONE-ACETAMINOPHEN 5-325 MG PO TABS: 1.0000 | ORAL_TABLET | Freq: Four times a day (QID) | ORAL | Status: DC | PRN

## 2014-07-17 NOTE — Discharge Instructions (Signed)
-  see above 

## 2014-07-17 NOTE — Discharge Summary (Signed)
Pt educated and discharged to home. PT walked down with family member.

## 2014-07-17 NOTE — Progress Notes (Signed)
1 Day Post-Op  Subjective: Doing well. Wants to go home. Ambulating. Voiding. Tolerating diet. Pain well controlled. Hemoglobin reportedly 6.8. Repeated and was 11.3. No sign of active bleeding.  Objective: Vital signs in last 24 hours: Temp:  [98.5 F (36.9 C)-99 F (37.2 C)] 98.6 F (37 C) (10/17 0905) Pulse Rate:  [56-83] 67 (10/17 0905) Resp:  [11-16] 16 (10/17 0905) BP: (80-127)/(46-79) 100/50 mmHg (10/17 0905) SpO2:  [95 %-100 %] 100 % (10/17 0905)    Intake/Output from previous day: 10/16 0701 - 10/17 0700 In: 2565 [P.O.:340; I.V.:2175] Out: 1675 [Urine:1500; Drains:175] Intake/Output this shift: Total I/O In: 120 [P.O.:120] Out: 182.5 [Urine:150; Drains:32.5]  General appearance: alert. Cooperative. Appears comfortable. Mental status normal. No distress. Breasts: , bilateral mastectomy wounds look good. Skin flaps viable. No ischemia. No hematoma or fluid collection. Both drains functioning normally with low output.  Lab Results:  Results for orders placed during the hospital encounter of 07/16/14 (from the past 24 hour(s))  CBC     Status: Abnormal   Collection Time    07/17/14  3:34 AM      Result Value Ref Range   WBC 4.1  4.0 - 10.5 K/uL   RBC 2.23 (*) 3.87 - 5.11 MIL/uL   Hemoglobin 6.8 (*) 12.0 - 15.0 g/dL   HCT 23.2 (*) 36.0 - 46.0 %   MCV 104.0 (*) 78.0 - 100.0 fL   MCH 30.5  26.0 - 34.0 pg   MCHC 29.3 (*) 30.0 - 36.0 g/dL   RDW 14.0  11.5 - 15.5 %   Platelets 86 (*) 150 - 400 K/uL  CBC     Status: Abnormal   Collection Time    07/17/14  5:24 AM      Result Value Ref Range   WBC 6.0  4.0 - 10.5 K/uL   RBC 3.63 (*) 3.87 - 5.11 MIL/uL   Hemoglobin 11.3 (*) 12.0 - 15.0 g/dL   HCT 32.9 (*) 36.0 - 46.0 %   MCV 90.6  78.0 - 100.0 fL   MCH 31.1  26.0 - 34.0 pg   MCHC 34.3  30.0 - 36.0 g/dL   RDW 12.0  11.5 - 15.5 %   Platelets 144 (*) 150 - 400 K/uL  TYPE AND SCREEN     Status: None   Collection Time    07/17/14  5:24 AM      Result Value Ref  Range   ABO/RH(D) B POS     Antibody Screen NEG     Sample Expiration 07/20/2014       Studies/Results: Nm Sentinel Node Inj-no Rpt (breast)  07/16/2014   CLINICAL DATA: Cancer of the right breast   Sulfur colloid was injected intradermally by the nuclear medicine  technologist for breast cancer sentinel node localization.     . heparin  5,000 Units Subcutaneous 3 times per day     Assessment/Plan: s/p Procedure(s): BILATERAL TOTAL  MASTECTOMY WITH RIGHT  AXILLARY SENTINEL NODE BIOPSY, PROPHYLATIC LEFT BREAST Mastectomy  POD 1. Doing well Discharge Prescription for Norco given Shoulder range of motion discussed. Wound care and drain care discussed Return to see Dr. Excell Seltzer in one week   @PROBHOSP @  LOS: 1 day    Christie Copley M 07/17/2014  . .prob

## 2014-07-17 NOTE — Progress Notes (Signed)
CRITICAL VALUE ALERT  Critical value received: hgb  6.8  Date of notification: 07/17/2014  Time of notification:  0428  Critical value read back:Yes.    Nurse who received alert: Deeann Dowse RN  MD notified (1st page): Dr. Hulen Skains  Time of first page:  0454  MD notified (2nd page):  Time of second page:  Responding MD: Dr. Hulen Skains  Time MD responded:  5638 orders received to repeat CBC and get a type and screen

## 2014-07-19 ENCOUNTER — Encounter: Payer: Self-pay | Admitting: Genetic Counselor

## 2014-07-19 DIAGNOSIS — Z8041 Family history of malignant neoplasm of ovary: Secondary | ICD-10-CM

## 2014-07-19 DIAGNOSIS — Z803 Family history of malignant neoplasm of breast: Secondary | ICD-10-CM

## 2014-07-19 DIAGNOSIS — C50919 Malignant neoplasm of unspecified site of unspecified female breast: Secondary | ICD-10-CM

## 2014-07-19 NOTE — Discharge Summary (Signed)
  Patient ID: Taylor Perez [id number] 43 y.o. 1971/04/19  07/16/2014  Discharge date and time: 07/19/2014   Admitting Physician: Excell Seltzer T  Discharge Physician: Excell Seltzer T  Admission Diagnoses: cancer right breast  Discharge Diagnoses: same  Operations: Procedure(s): BILATERAL TOTAL  MASTECTOMY WITH RIGHT  AXILLARY SENTINEL NODE BIOPSY, PROPHYLATIC LEFT BREAST Mastectomy  Admission Condition: good  Discharged Condition: good  Indication for Admission: patient has a recent diagnosis of a large cancer of the right breast. After extensive preoperative workup and discussion detailed elsewhere she is electively admitted for right total mastectomy with sentinel lymph node biopsy and prophylactic left mastectomy  Hospital Course: on the morning of admission she underwent right total mastectomy with a negative sentinel lymph node biopsy and prophylactic left total mastectomy. She tolerated the procedures well and had no complications. She was ready for discharge the following morning   Disposition: Home  Patient Instructions:    Medication List         albuterol 108 (90 BASE) MCG/ACT inhaler  Commonly known as:  PROVENTIL HFA;VENTOLIN HFA  Inhale 1 puff into the lungs every 6 (six) hours as needed for wheezing or shortness of breath.     CALCIUM-MAGNESIUM-ZINC PO  Take 1 tablet by mouth daily.     CATS CLAW PO  Take 1 drop by mouth daily.     HYDROcodone-acetaminophen 5-325 MG per tablet  Commonly known as:  NORCO  Take 1-2 tablets by mouth every 6 (six) hours as needed for moderate pain or severe pain.     KELP PO  Take 225 mcg by mouth daily.     KRILL OIL PO  Take 2 tablets by mouth daily.     MELATONIN PO  Take 1 tablet by mouth at bedtime.     MULTI-VITAMIN PO  Take 1 tablet by mouth daily.     SELENIUM PO  Take 200 mcg by mouth daily.     VITAMIN D3 PO  Take 5,600 Int'l Units by mouth daily.     VITAMIN E PO  Take 1 tablet by mouth  daily.        Activity: activity as tolerated Diet: regular diet Wound Care: JP drain care explained prior to discharge  Follow-up:  WithDr. Excell Seltzer in 1 week.  Signed: Edward Jolly MD, FACS  07/19/2014, 4:32 PM

## 2014-07-19 NOTE — Progress Notes (Signed)
HPI:  Taylor Perez was previously seen in the [place] clinic due to a personal and family history of cancer and concerns regarding a hereditary predisposition to cancer. Please refer to our prior cancer genetics clinic note for more information regarding Taylor Perez's medical, social and family histories, and our assessment and recommendations, at the time. Taylor Perez recent genetic test results were disclosed to her, as were recommendations warranted by these results. These results and recommendations are discussed in more detail below.  GENETIC TEST RESULTS: At the time of Taylor Perez's visit, we recommended she pursue genetic testing of the [employer]vaNext gene panel. This test, which included sequencing and deletion/duplication analysis of the genes, was performed at [facility]. Genetic testing was normal, and did not reveal a deleterious mutation in these genes. A complete list of all genes tested is located on the test report scanned into EP[employer].    We discussed with Taylor Perez that since the current genetic testing is not perfect, it is possible there may be a gene mutation in one of these genes that current testing cannot detect, but that chance is small.  We also discussed, that it is possible that another gene that has not yet been discovered, or that we have not yet tested, is responsible for the cancer diagnoses in the family, and it is, therefore, important to remain in touch with cancer genetics in the future so that we can continue to offer Taylor Perez the most up to date genetic testing.   CANCER SCREENING RECOMMENDATIONS: This result is reassuring and suggests that Taylor Perez's cancer was most likely not due to an inherited predisposition associated with one of these genes.  Most cancers happen by chance and this negative test suggests that her cancer falls into this category.  We, therefore, recommended she continue to follow the cancer management and screening guidelines  provided by her oncology and primary providers.   RECOMMENDATIONS FOR FAMILY MEMBERS:  While these results are reassuring for Taylor Perez, this test does not tell us anything about Taylor Perez paternal relatives' risks. We recommended these relatives also have genetic counseling and testing. Please let us know if we can help facilitate testing. Genetic counselors can be located in other cities, by visiting the website of the [employer] (Artist[employer].se) and Field seismologist for a Dietitian by zip code.    FOLLOW-UP: Lastly, we discussed with Taylor Perez that cancer genetics is a rapidly advancing field and it is possible that new genetic tests will be appropriate for her and/or her family members in the future. We encouraged her to remain in contact with cancer genetics on an annual basis so we can update her personal and family histories and let her know of advances in cancer genetics that may benefit this family.   Our contact number was provided. Taylor Perez questions were answered to her satisfaction, and she knows she is welcome to call us at anytime with additional questions or concerns.   Catherine A. Fine, MS, CGC Certified Psychologist, sport and [email or web address]

## 2014-07-20 ENCOUNTER — Encounter (HOSPITAL_COMMUNITY): Payer: Self-pay | Admitting: General Surgery

## 2014-07-20 ENCOUNTER — Encounter: Payer: Self-pay | Admitting: *Deleted

## 2014-07-20 NOTE — Progress Notes (Signed)
Ordered oncotype per Dr. Jana Hakim.  Faxed requisition to pathology and confirmed receipts with Barry.  Faxed PAC to [place].

## 2014-07-21 ENCOUNTER — Encounter: Payer: Self-pay | Admitting: Oncology

## 2014-07-21 NOTE — Progress Notes (Signed)
Pt applied for the [employer] grant by providing me with her pay check stubs.  Unfortunately I wasn't able to approve her at this time because she makes over the qualifying limit.  I communicated this to the pt, she verbalized understanding and thanked me for looking into it.  I emailed her info on the [place].

## 2014-07-22 ENCOUNTER — Telehealth (INDEPENDENT_AMBULATORY_CARE_PROVIDER_SITE_OTHER): Payer: Self-pay | Admitting: General Surgery

## 2014-07-22 ENCOUNTER — Other Ambulatory Visit (HOSPITAL_BASED_OUTPATIENT_CLINIC_OR_DEPARTMENT_OTHER): Payer: BC Managed Care – PPO

## 2014-07-22 DIAGNOSIS — C50419 Malignant neoplasm of upper-outer quadrant of unspecified female breast: Secondary | ICD-10-CM

## 2014-07-22 DIAGNOSIS — C50411 Malignant neoplasm of upper-outer quadrant of right female breast: Secondary | ICD-10-CM

## 2014-07-22 LAB — COMPREHENSIVE METABOLIC PANEL (CC13)
ALT: 15 U/L (ref 0–55)
AST: 15 U/L (ref 5–34)
Albumin: 3.6 g/dL (ref 3.5–5.0)
Alkaline Phosphatase: 51 U/L (ref 40–150)
Anion Gap: 9 meq/L (ref 3–11)
BUN: 6.6 mg/dL — ABNORMAL LOW (ref 7.0–26.0)
CO2: 26 meq/L (ref 22–29)
Calcium: 9.6 mg/dL (ref 8.4–10.4)
Chloride: 106 meq/L (ref 98–109)
Creatinine: 0.8 mg/dL (ref 0.6–1.1)
Glucose: 81 mg/dL (ref 70–140)
Potassium: 4.2 meq/L (ref 3.5–5.1)
Sodium: 141 meq/L (ref 136–145)
Total Bilirubin: 0.49 mg/dL (ref 0.20–1.20)
Total Protein: 7.1 g/dL (ref 6.4–8.3)

## 2014-07-22 LAB — CBC WITH DIFFERENTIAL/PLATELET
BASO%: 0.9 % (ref 0.0–2.0)
Basophils Absolute: 0 10*3/uL (ref 0.0–0.1)
EOS ABS: 0.3 10*3/uL (ref 0.0–0.5)
EOS%: 7.9 % — AB (ref 0.0–7.0)
HCT: 42.4 % (ref 34.8–46.6)
HGB: 14.3 g/dL (ref 11.6–15.9)
LYMPH%: 18.5 % (ref 14.0–49.7)
MCH: 30.5 pg (ref 25.1–34.0)
MCHC: 33.8 g/dL (ref 31.5–36.0)
MCV: 90.3 fL (ref 79.5–101.0)
MONO#: 0.5 10*3/uL (ref 0.1–0.9)
MONO%: 10.8 % (ref 0.0–14.0)
NEUT%: 61.9 % (ref 38.4–76.8)
NEUTROS ABS: 2.7 10*3/uL (ref 1.5–6.5)
Platelets: 169 10*3/uL (ref 145–400)
RBC: 4.7 10*6/uL (ref 3.70–5.45)
RDW: 12.4 % (ref 11.2–14.5)
WBC: 4.3 10*3/uL (ref 3.9–10.3)
lymph#: 0.8 10*3/uL — ABNORMAL LOW (ref 0.9–3.3)

## 2014-07-22 LAB — FOLLICLE STIMULATING HORMONE: FSH: 2.5 m[IU]/mL

## 2014-07-22 NOTE — Telephone Encounter (Signed)
Called pt re path report

## 2014-07-27 ENCOUNTER — Encounter (HOSPITAL_COMMUNITY): Payer: Self-pay

## 2014-07-27 ENCOUNTER — Telehealth (INDEPENDENT_AMBULATORY_CARE_PROVIDER_SITE_OTHER): Payer: Self-pay

## 2014-07-27 LAB — ESTRADIOL, ULTRA SENS: ESTRADIOL, ULTRA SENSITIVE: 77 pg/mL

## 2014-07-27 NOTE — Telephone Encounter (Signed)
Pt is s/p bilateral mastectomies.  She has had one drain removed and one still in place. Pt released to fly out of town for 1 week by Dr. Excell Seltzer.

## 2014-07-28 ENCOUNTER — Encounter: Payer: Self-pay | Admitting: *Deleted

## 2014-07-28 NOTE — Progress Notes (Signed)
Received Oncotype Dx results of 16.  Emailed results to Dr. Jana Hakim and placed one in his box.  Took a copy to HIM to scan.

## 2014-08-05 ENCOUNTER — Ambulatory Visit (HOSPITAL_BASED_OUTPATIENT_CLINIC_OR_DEPARTMENT_OTHER): Payer: BC Managed Care – PPO | Admitting: Oncology

## 2014-08-05 VITALS — BP 108/69 | HR 60 | Temp 97.8°F | Resp 18 | Ht 65.0 in | Wt 139.1 lb

## 2014-08-05 DIAGNOSIS — Z170 Estrogen receptor positive status [ER+]: Secondary | ICD-10-CM

## 2014-08-05 DIAGNOSIS — C50411 Malignant neoplasm of upper-outer quadrant of right female breast: Secondary | ICD-10-CM

## 2014-08-05 NOTE — Progress Notes (Signed)
[place]  [phone] [phone]     ID: Sanita Estrada DOB: 1971-01-27  MR#: [id number]  IHK#:[id number]  Patient Care Team: Kathyrn Lass, MD as PCP - General (Family Medicine) Excell Seltzer, MD as Consulting Physician (General Surgery) Chauncey Cruel, MD as Consulting Physician (Oncology) Thea Silversmith, MD as Consulting Physician (Radiation Oncology)  CHIEF COMPLAINT: Estrogen receptor positive breast cancer  CURRENT TREATMENT: Awaiting definitive surgery   BREAST CANCER HISTORY: "Candy" noted a change in her right breast sometime around February or March 2015. She waited to see whether it would resolve, but as it did not she eventually brought it to the attention of Dr. Sabra Heck, who confirmed the finding and set up the patient for bilateral diagnostic mammography and right breast ultrasonography at the [facility] 05/28/2014. Breast density was category D. There was an irregular mass with spiculations in the right breast upper-outer quadrant which was palpable and described as hard. Ultrasound confirmed an irregular spiculated hypoechoic mass occupying the entire upper outer quadrant of the right breast and measuring at least 4.5 cm. In the right axilla there was a lymph node which was normal in size but showed cortical nodularity and abnormal peripheral hypervascularity.  Accordingly on 06/04/2014 the patient underwent biopsy of the breast mass (the lymph node described from the prior ultrasound was found to have a large fatty hilum and symmetric cortex but a moderate size artery and vein draped over it and therefore it was not biopsied). The pathology from this procedure showed (SAA [phone]) and invasive ductal carcinoma, E-cadherin positive, estrogen receptor 91% positive, progesterone receptor 58% positive, both with moderate staining intensity, with an MIB-1 of 9%, and no HER-2 amplification, the signals ratio being 1.17 and the number per  cell 1.70.  On 06/11/2014 the patient underwent bilateral breast MRI. This showed an area of architectural distortion an irregular clumped enhancement extending for a total of 7.4 cm in the right breast. He comes close to the skin of the anterior lateral right breast but no definite abnormal skin enhancement was noted. There were no findings suggestive of nipple involvement and no findings of the pectoralis involvement. The left breast and axillary lymph nodes are unremarkable. However there was a nonenhancing T2 increased signal along the left aspect of the lower thoracic vertebral body which was felt to require further evaluation.  The patient's subsequent history is as detailed below  INTERVAL HISTORY: Freida Busman returns today for follow-up of her breast cancer accompanied by her mother. Since her last visit here she underwent bilateral mastectomies with right sentinel lymph node sampling. The final pathology (07/16/2014-[facility] 15-04/01/2008) showed in the right breast 2 foci of invasive ductal carcinoma, grade 2, the largest one measuring 5.2 cm. The second tumor was smaller and morphologically similar. Both sentinel lymph nodes were clear. Margins were ample. Repeat HER-2 was again negative. The left mastectomy showed no evidence of malignancy.  In addition, an Oncotype DX was obtained from the larger tumor and it showed a score of 16, predicting a 10 year risk of outside the breast recurrence of 10% if the patient's only systemic therapy was tamoxifen for 5 years. In addition it predicts no significant benefit from chemotherapy.  Finally, candies situation was discussed at the multidisciplinary breast cancer conference 08/04/2013. The consensus was that she would benefit from postmastectomy radiation, followed by adjuvant antiestrogens. The patient is here today to clarify the chemotherapy decision.  REVIEW OF SYSTEMS: Candy did well with her surgery, and while still having a  drain in place travel to  [place] and [place]. Her second drain came out today. She has had no dehiscence, erythema, or swelling. She does describe some fatigue. She has some soreness over both breasts. She has some numbness over the skin of the anterior chest wall. She feels anxious, but not depressed. A detailed review of systems today was otherwise entirely negative.  PAST MEDICAL HISTORY: Past Medical History  Diagnosis Date  . Hashimoto's thyroiditis   . Vitamin D deficiency   . Anxiety   . Other isolated or specific phobias   . Stroke 2008    mini stroke  . Asthma   . Pneumonia 2006  . Headache     sinus  . Arthritis     right arm and shoulder  . Cancer     breast  . Peripheral vascular disease     varicose veins    PAST SURGICAL HISTORY: Past Surgical History  Procedure Laterality Date  . Vein ligation and stripping    . Tubal ligation  1994  . Abdominal hysterectomy  1997  . Total mastectomy Bilateral 07/16/2014    WITH SENTINAL NODE BIOPSY  . Bilateral total mastectomy with axillary lymph node dissection Bilateral 07/16/2014    Procedure: BILATERAL TOTAL  MASTECTOMY WITH RIGHT  AXILLARY SENTINEL NODE BIOPSY, PROPHYLATIC LEFT BREAST Mastectomy;  Surgeon: Excell Seltzer, MD;  Location: [facility] OR;  Service: General;  Laterality: Bilateral;    FAMILY HISTORY Family History  Problem Relation Age of Onset  . Breast cancer Paternal Aunt 54  . Ovarian cancer Paternal Grandmother 41  . Lung cancer Paternal Grandfather 48  . Breast cancer Paternal Aunt 49    reportedly BRCA negative.    The patient's father died from complications of pulmonary fibrosis at the age of 68. The patient's mother is living, age 22. The patient had one brother, no sisters. The patient's paternal grandmother had ovarian cancer. The patient's father had 3 sisters. 2 of them have been diagnosed with breast cancer, one in her 32s one in her 55s. The one in her 23s was tested for the BRCA genes and was  negative.  GYNECOLOGIC HISTORY:  No LMP recorded. Patient has had a hysterectomy. Menarche age 47, first live birth age 51. The patient is GX P2. She had a hysterectomy in 1997. She is not on hormone replacement.  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 She is divorced. At home she lives with her mother, Lorae Roig, who semiretired. She has worked in Scientist, research (medical). The patient's son Ieasha Boerema is currently 4 attends and she stated where he is a Ship broker. The patient's other son died at the age of 22. The patient has no grandchildren. She is not a Ambulance person.    ADVANCED DIRECTIVES: Not in place. At her 06/16/2014 visit the patient was given the appropriate documents to complete. She is considering naming her brother as her healthcare power of attorney.   HEALTH MAINTENANCE: History  Substance Use Topics  . Smoking status: Never Smoker   . Smokeless tobacco: Never Used  . Alcohol Use: Yes     Comment: not lately     Colonoscopy:  PAP:  Bone density:  Lipid panel:  Allergies  Allergen Reactions  . Demerol [Meperidine]     seizures  . Other Itching    Melon    Current Outpatient Prescriptions  Medication Sig Dispense Refill  . albuterol (PROVENTIL HFA;VENTOLIN  HFA) 108 (90 BASE) MCG/ACT inhaler Inhale 1 puff into the lungs every 6 (six) hours as needed for wheezing or shortness of breath.    Marland Kitchen CALCIUM-MAGNESIUM-ZINC PO Take 1 tablet by mouth daily.    . Cats Claw, Uncaria tomentosa, (CATS CLAW PO) Take 1 drop by mouth daily.    . Cholecalciferol (VITAMIN D3 PO) Take 5,600 Int'l Units by mouth daily.    Marland Kitchen HYDROcodone-acetaminophen (NORCO) 5-325 MG per tablet Take 1-2 tablets by mouth every 6 (six) hours as needed for moderate pain or severe pain. 30 tablet 0  . Iodine, Kelp, (KELP PO) Take 225 mcg by mouth daily.    Marland Kitchen KRILL OIL PO Take 2 tablets by mouth daily.    Marland Kitchen MELATONIN PO Take 1 tablet by mouth  at bedtime.    . Multiple Vitamin (MULTI-VITAMIN PO) Take 1 tablet by mouth daily.    . SELENIUM PO Take 200 mcg by mouth daily.    Marland Kitchen VITAMIN E PO Take 1 tablet by mouth daily.     No current facility-administered medications for this visit.    OBJECTIVE: Middle-aged white woman who appears stated age 43 Vitals:   08/05/14 1637  BP: 108/69  Pulse: 60  Temp: 97.8 F (36.6 C)  Resp: 18     Body mass index is 23.15 kg/(m^2).    ECOG FS:1 - Symptomatic but completely ambulatory  Sclerae unicteric, pupils equal and reactive Oropharynx clear, teeth in good repair No cervical or supraclavicular adenopathy Lungs no rales or rhonchi Heart regular rate and rhythm Abd soft, nontender, positive bowel sounds MSK no focal spinal tenderness, no upper extremity lymphedema Neuro: nonfocal, well oriented, appropriate affect Breasts: status post bilateral mastectomies.the incisions are healing very nicely. There is no evidence chest wall recurrence. Both axillae are benign.  LAB RESULTS:  CMP     Component Value Date/Time   NA 141 07/22/2014 0808   NA 136* 07/17/2014 0524   K 4.2 07/22/2014 0808   K 4.0 07/17/2014 0524   CL 104 07/17/2014 0524   CO2 26 07/22/2014 0808   CO2 23 07/17/2014 0524   GLUCOSE 81 07/22/2014 0808   GLUCOSE 107* 07/17/2014 0524   BUN 6.6* 07/22/2014 0808   BUN 6 07/17/2014 0524   CREATININE 0.8 07/22/2014 0808   CREATININE 0.72 07/17/2014 0524   CALCIUM 9.6 07/22/2014 0808   CALCIUM 8.1* 07/17/2014 0524   PROT 7.1 07/22/2014 0808   ALBUMIN 3.6 07/22/2014 0808   AST 15 07/22/2014 0808   ALT 15 07/22/2014 0808   ALKPHOS 51 07/22/2014 0808   BILITOT 0.49 07/22/2014 0808   GFRNONAA >90 07/17/2014 0524   GFRAA >90 07/17/2014 0524    I No results found for: SPEP  Lab Results  Component Value Date   WBC 4.3 07/22/2014   NEUTROABS 2.7 07/22/2014   HGB 14.3 07/22/2014   HCT 42.4 07/22/2014   MCV 90.3 07/22/2014   PLT 169 07/22/2014      Chemistry       Component Value Date/Time   NA 141 07/22/2014 0808   NA 136* 07/17/2014 0524   K 4.2 07/22/2014 0808   K 4.0 07/17/2014 0524   CL 104 07/17/2014 0524   CO2 26 07/22/2014 0808   CO2 23 07/17/2014 0524   BUN 6.6* 07/22/2014 0808   BUN 6 07/17/2014 0524   CREATININE 0.8 07/22/2014 0808   CREATININE 0.72 07/17/2014 0524      Component Value Date/Time   CALCIUM 9.6 07/22/2014 4128  CALCIUM 8.1* 07/17/2014 0524   ALKPHOS 51 07/22/2014 0808   AST 15 07/22/2014 0808   ALT 15 07/22/2014 0808   BILITOT 0.49 07/22/2014 0808       No results found for: LABCA2  No components found for: QMGQQ761  No results for input(s): INR in the last 168 hours.  Urinalysis No results found for: COLORURINE  STUDIES: Dg Chest 2 View  07/08/2014   CLINICAL DATA:  Initial evaluation for preoperative imaging, history of stroke, peripheral vascular disease, and current history of breast cancer, for right total mastectomy and sentinel node biopsy  EXAM: CHEST  2 VIEW  COMPARISON:  None.  FINDINGS: The heart size and mediastinal contours are within normal limits. Both lungs are clear. The visualized skeletal structures are unremarkable.  IMPRESSION: No active cardiopulmonary disease.   Electronically Signed   By: Skipper Cliche M.D.   On: 07/08/2014 09:13   Nm Sentinel Node Inj-no Rpt (breast)  07/16/2014   CLINICAL DATA: Cancer of the right breast   Sulfur colloid was injected intradermally by the nuclear medicine  technologist for breast cancer sentinel node localization.     ASSESSMENT: 43 y.o. BRCA negative [place] woman status post right breast upper-outer quadrant biopsy 06/04/2014 for a clinical T3 N0-1, stage II/III invasive ductal carcinoma, grade 2, estrogen receptor 91% positive, progesterone receptor 58% positive, with an MIB-1 of 9%, and no HER-2 amplification  (1) status post right mastectomy with sentinel lymph node sampling 07/16/2014 for an mpT3 pN0, stage IIA invasive ductal  carcinoma, grade 2, with repeat HER-2 again negative. Margins were ample. Simple left mastectomy showed no evidence of malignancy  (2) Oncotype score of 16 predicts a risk of recurrence outside the breast over the next 10 years of 10% if the patient's only systemic therapy is tamoxifen for 5 years. It also predicts no benefit from chemotherapy.  (3) the patient is considering postmastectomy radiation  (4) tamoxifen to follow radiation  (5) the patient is not planning on reconstruction  PLAN: We spent approximately 55 minutes today going over Candy's situation. She had very dense breasts, which explains why the tumor became so big before it could be discovered. She has resolved the dense breast problem by simply having the breasts removed. She needs no imaging for follow-up but only physical exam of the chestby a physician once a year.  Because the cancer was greater than 5 cm postmastectomy radiation is recommended. She is very reluctant to proceed with radiation, but agreed to meet with a radiation oncologist to get the data.  We discussed the fact that her tumor types out as a luminal A subtype. Patient's who have that type of breast cancer generally do well and get very little benefit from chemotherapy. The Oncotype report confirms that. The benefit it shows for chemotherapy for patients like her is in the 1-2% range. Accordingly she is very comfortable not receiving chemotherapy and that is our recommendation as well.  Today we also began a discussion regarding anti-estrogens. She understands that everyone in the study which provides the database 4 Oncotype took tamoxifen for 5 years, because that was the optimal treatment known at that time. Today we know that for example 10 years of tamoxifen or 5 years of anastrozole will further reduce the risk of recurrence. Accordingly she can end up with a 97% or so chance of this cancer not recurring within the next 10 years by taking one of those better  options.  Tentatively I am making her a  return appointment with me for January. That should be some weeks after completing radiation. If however she makes a definitive decision not to receive radiation she will let me know and I will see her sooner so we can start anti-estrogens without significant delay.  Candy has a good understanding of the overall plan. She agrees with it. She knows the goal of treatment in her case is cure. She will call with any problems that may develop before her next visit here.  Chauncey Cruel, MD   08/05/2014 5:14 PM

## 2014-08-06 ENCOUNTER — Telehealth: Payer: Self-pay | Admitting: Oncology

## 2014-08-06 NOTE — Telephone Encounter (Signed)
, °

## 2014-08-07 NOTE — Addendum Note (Signed)
Addended by: Jaci Carrel A on: 08/07/2014 08:47 AM   Modules accepted: Medications

## 2014-08-08 ENCOUNTER — Other Ambulatory Visit: Payer: Self-pay | Admitting: Oncology

## 2014-08-08 DIAGNOSIS — C50919 Malignant neoplasm of unspecified site of unspecified female breast: Secondary | ICD-10-CM

## 2014-08-09 ENCOUNTER — Telehealth: Payer: Self-pay | Admitting: Oncology

## 2014-08-09 NOTE — Telephone Encounter (Signed)
, °

## 2014-08-18 ENCOUNTER — Encounter: Payer: Self-pay | Admitting: Radiation Oncology

## 2014-08-18 NOTE — Progress Notes (Signed)
Location of Breast Cancer:Invasive ductal carcinoma grade 2 right upper-outer breast.4.5 cm mass.  Histology per Pathology Report:07/16/14  1. Lymph node, sentinel, biopsy, right axillary #1 - THERE IS NO EVIDENCE OF CARCINOMA IN 1 OF 1 LYMPH NODE (0/1). 2. Lymph node, sentinel, biopsy, right axillary #2 - THERE IS NO EVIDENCE OF CARCINOMA IN 1 OF 1 LYMPH NODE (0/1). 3. Breast, simple mastectomy, left - FIBROCYSTIC CHANGES. - PSEUDOANGIOMATOUS STROMAL HYPERPLASIA ([place]). - THERE IS NO EVIDENCE OF MALIGNANCY. 4. Breast, simple mastectomy, right - INVASIVE DUCTAL CARCINOMA, GRADE II/III, TWO FOCI, THE LARGER SPANS 5.2 CM. 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  Receptor Status: ER(+) 91%, PR (+) 58%, Her2-neu (-)  Did patient present with symptoms (if so, please note symptoms) or was this found on screening mammography?:Change in breast noted by patient later confirmed by mammography and right breast ultrasonography.  Past/Anticipated interventions by surgeon, if any:07/16/14  BILATERAL TOTAL MASTECTOMY WITH AXILLARY LYMPH NODE DISSECTION Past/Anticipated interventions by medical oncology, if any: Chemotherapy Dr Jana Hakim- Accordingly she is very comfortable not receiving chemotherapy and that is our recommendation as well.  Onco-type score 16  Lymphedema issues, if any: no  Pain issues, if any: no SAFETY ISSUES:  Prior radiation? No  Pacemaker/ICD? No  Possible current pregnancy?  Is the patient on methotrexate? No  Current Complaints / other details: GYNECOLOGIC HISTORY:  No LMP recorded. Patient has had a hysterectomy. Menarche age 45, first live birth age 57. The patient is GX P2. She had a hysterectomy in 1997. She is not on hormone replacement. 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  Divorced, lives with her mother. 2 sons, 1 deceased age 18.   No history of smoking. Occasional  etoh Allergies:demerol and melon    Arlyss Repress, RN 08/18/2014,10:16 AM

## 2014-08-20 ENCOUNTER — Encounter: Payer: Self-pay | Admitting: Radiation Oncology

## 2014-08-20 ENCOUNTER — Ambulatory Visit
Admission: RE | Admit: 2014-08-20 | Discharge: 2014-08-20 | Disposition: A | Discharge status: Home / Self Care | Payer: BLUE CROSS/BLUE SHIELD | Source: Ambulatory Visit | Attending: Radiation Oncology | Admitting: Radiation Oncology

## 2014-08-20 VITALS — BP 107/65 | HR 82 | Temp 98.2°F | Resp 18 | Ht 65.0 in | Wt 139.1 lb

## 2014-08-20 DIAGNOSIS — Z9013 Acquired absence of bilateral breasts and nipples: Secondary | ICD-10-CM | POA: Insufficient documentation

## 2014-08-20 DIAGNOSIS — C50411 Malignant neoplasm of upper-outer quadrant of right female breast: Secondary | ICD-10-CM

## 2014-08-20 DIAGNOSIS — Z801 Family history of malignant neoplasm of trachea, bronchus and lung: Secondary | ICD-10-CM | POA: Diagnosis not present

## 2014-08-20 DIAGNOSIS — Z510 Encounter for antineoplastic radiation therapy: Secondary | ICD-10-CM | POA: Diagnosis not present

## 2014-08-20 DIAGNOSIS — Z803 Family history of malignant neoplasm of breast: Secondary | ICD-10-CM | POA: Insufficient documentation

## 2014-08-20 DIAGNOSIS — Z170 Estrogen receptor positive status [ER+]: Secondary | ICD-10-CM | POA: Diagnosis not present

## 2014-08-20 HISTORY — DX: Malignant neoplasm of unspecified site of unspecified female breast: C50.919

## 2014-08-20 NOTE — Progress Notes (Signed)
Please see the Nurse Progress Note in the MD Initial Consult Encounter for this patient. 

## 2014-08-23 NOTE — Progress Notes (Signed)
Radiation Oncology         [phone]) [phone] ________________________________  Initial outpatient Consultation - Date: 08/20/2014   Name: Melda Mermelstein MRN: [id number]   DOB: [birth year]  REFERRING PHYSICIAN: Excell Seltzer, MD  DIAGNOSIS:    ICD-9-CM ICD-10-CM   1. Breast cancer of upper-outer quadrant of right female breast 174.4 C50.411     STAGE: Breast cancer of upper-outer quadrant of right female breast   Staging form: Breast, AJCC 7th Edition     Clinical: Stage IIB (T3, N0, cM0) - Unsigned       Staging comments: Staged at breast conference 06/16/14.   HISTORY OF PRESENT ILLNESS::Taylor Perez is a 43 y.o. female  Who presented with a large palpable right breast mass. Ultrasound confirmed an irregular spiculated hypoechoic mass occupying the entire upper outer quadrant of the right breast and measuring at least 4.5 cm. In the right axilla there was a lymph node which was normal in size but showed cortical nodularity and abnormal peripheral hypervascularity.  On 06/04/2014 the patient underwent biopsy of the breast mass (the lymph node described from the prior ultrasound was found to have a large fatty hilum and symmetric cortex but a moderate size artery and vein draped over it and therefore it was not biopsied). The pathology from this procedure showed invasive ductal carcinoma  estrogen receptor 91% positive, progesterone receptor 58% positive, HER2 negative.  On 06/11/2014 the patient underwent bilateral breast MR which showed an area of architectural distortion an irregular clumped enhancement extending for a total of 7.4 cm in the right breast, no nipple, skin or chest wall invovlement. The left breast and axillary lymph nodes are unremarkable. However there was a nonenhancing T2 increased signal along the left aspect of the lower thoracic vertebral body which was felt to require further evaluation. She had a PET scan on 9/21 which showed no evidence of metastatic disease. An MRI  of the brain was also negative.She elected for bilateral mastectomies and had those performed on 10/16 which showed 2 foci of invasive ductal carcinoma, grade 2, the largest one measuring 5.2 cm in the right breast. Margins were negative and both sentinel lymph nodes were negative. The left mastectomy showed no evidence of malignancy.  She met with medical oncology and had a low Oncotype DX showed no benefit from chemotherapy and anitestrogen was recommended. She was referred to me today for discussion of radiation in the management of her disease.   PREVIOUS RADIATION THERAPY: No  FAMILY HISTORY:  Family History  Problem Relation Age of Onset  . Breast cancer Paternal Aunt 32  . Ovarian cancer Paternal Grandmother 32  . Lung cancer Paternal Grandfather 61  . Breast cancer Paternal Aunt 60    reportedly BRCA negative.   . Depression Mother   . Pulmonary fibrosis Father   . Diabetes Father     SOCIAL HISTORY:  History  Substance Use Topics  . Smoking status: Never Smoker   . Smokeless tobacco: Never Used  . Alcohol Use: Yes     Comment: not lately    REVIEW OF SYSTEMS:  A 15 point review of systems is documented in the electronic medical record. This was obtained by the nursing staff. However, I reviewed this with the patient to discuss relevant findings and make appropriate changes.  Pertinent positives are included in the chart.   PHYSICAL EXAM:  Filed Vitals:   08/20/14 1450  BP: 107/65  Pulse: 82  Temp: 98.2 F (36.8 C)  Resp: 18  .  139 lb 1.6 oz (63.095 kg). Pleasant female. No distress. Imaging reviewed. S/p bilateral mastectomies. Healing well. Good range of motion in bilateral arms.   IMPRESSION: T3N0 Invasive Ductal Carcinoma of the Right Breast  PLAN: I spoke to the patient today regarding her diagnosis and options for treatment.  We discussed the role of radiation in decreasing local failures in patients who undergo mastectomy and have positive lymph nodes or tumors  greater than 5 cm.We discussed her addiitonal risk factors of diffuse lymphovascular invasion and her age. We discussed the possible side effects including but not limited to asymptomatic rib, heart and lung damage, heart disease, skin redness, fatigue, permanent skin darkening, and chest wall swelling. We discussed increased complications that can occur with reconstruction after radiation. We discussed the process of simulation and the placement tattoos. We discussed the low likelihood of secondary malignancies.    She is strongly considering not going forward with radiatin. She believes in naturopathic approaches and also would like to discuss her situation with her son who is coming home for Thanksgiving.  I offered to have one of my patients call her and discuss treatment with her who comes from a similar background.  She was very excited about this and asked that she call after Thanksgiving.   We discussed alternatives to radiation including observation or antiestrogen treatment. We discussed that chest wall recurrence often was accompanied by distant recurrence and often chest wall recurrences were incurable or required extensive surgery.   I gave her information on simulation and walked her through the process of treatment. She will call and let me know if she would like to proceed.   I spent 60 minutes  face to face with the patient and more than 50% of that time was spent in counseling and/or coordination of care.   ------------------------------------------------  Thea Silversmith, MD

## 2014-09-07 ENCOUNTER — Ambulatory Visit
Admission: RE | Admit: 2014-09-07 | Discharge: 2014-09-07 | Disposition: A | Discharge status: Home / Self Care | Payer: BLUE CROSS/BLUE SHIELD | Source: Ambulatory Visit | Attending: Radiation Oncology | Admitting: Radiation Oncology

## 2014-09-07 DIAGNOSIS — C50411 Malignant neoplasm of upper-outer quadrant of right female breast: Secondary | ICD-10-CM

## 2014-09-07 DIAGNOSIS — Z510 Encounter for antineoplastic radiation therapy: Secondary | ICD-10-CM | POA: Diagnosis not present

## 2014-09-07 NOTE — Progress Notes (Signed)
Name: Cyra Spader   MRN: [id number]  Date:  09/07/2014  DOB: 1971-05-02  Status:outpatient    DIAGNOSIS: Breast cancer.  CONSENT VERIFIED: yes   SET UP: Patient is setup supine   IMMOBILIZATION:  The following immobilization was used:Custom Moldable Pillow, breast board.   NARRATIVE: Ms. Drum was brought to the [facility].  Identity was confirmed.  All relevant records and images related to the planned course of therapy were reviewed.  Then, the patient was positioned in a stable reproducible clinical set-up for radiation therapy.  Wires were placed to delineate the clinical extent of breast tissue. A wire was placed on the scar as well.  CT images were obtained.  An isocenter was placed. Skin markings were placed.  The CT images were loaded into the planning software where the target and avoidance structures were contoured.  The radiation prescription was entered and confirmed. The patient was discharged in stable condition and tolerated simulation well.    TREATMENT PLANNING NOTE:  Treatment planning then occurred. I have requested : MLC's, isodose plan, basic dose calculation  I personally designed and supervised the construction of 3 medically necessary complex treatment devices for the protection of critical normal structures including the lungs and contralateral breast as well as the immobilization device which is necessary for set up certainty.   3D simulation was performed. I personally designed and supervised the construction of 3 medically necessary complex treatment devices in the form of MLCs which will be used for beam modification and to protect critical structures including the heart and lung as well as the immobilization device which is necessary for reproducible set up. I have requested a dose volume histogram of the heart, lung and tumor cavity.

## 2014-09-07 NOTE — Progress Notes (Signed)
  Radiation Oncology         [phone] ________________________________  Name: Taylor Perez MRN: [id number]  Date: 09/07/2014  DOB: August 18, 1971  Simulation Verification Note  Status: outpatient  NARRATIVE: The patient was brought to the treatment unit and placed in the planned treatment position. The clinical setup was verified. Then port films were obtained and uploaded to the radiation oncology medical record software.  The treatment beams were carefully compared against the planned radiation fields. The position location and shape of the radiation fields was reviewed. The targeted volume of tissue appears appropriately covered by the radiation beams. Organs at risk appear to be excluded as planned.  Based on my personal review, I approved the simulation verification. The patient's treatment will proceed as planned.  ------------------------------------------------  Thea Silversmith, MD

## 2014-09-09 DIAGNOSIS — Z510 Encounter for antineoplastic radiation therapy: Secondary | ICD-10-CM | POA: Diagnosis not present

## 2014-09-16 DIAGNOSIS — Z510 Encounter for antineoplastic radiation therapy: Secondary | ICD-10-CM | POA: Diagnosis not present

## 2014-09-27 ENCOUNTER — Ambulatory Visit
Admission: RE | Admit: 2014-09-27 | Discharge: 2014-09-27 | Disposition: A | Discharge status: Home / Self Care | Payer: BLUE CROSS/BLUE SHIELD | Source: Ambulatory Visit | Attending: Radiation Oncology | Admitting: Radiation Oncology

## 2014-09-27 DIAGNOSIS — C50411 Malignant neoplasm of upper-outer quadrant of right female breast: Secondary | ICD-10-CM

## 2014-09-27 DIAGNOSIS — Z510 Encounter for antineoplastic radiation therapy: Secondary | ICD-10-CM | POA: Diagnosis not present

## 2014-09-27 NOTE — Progress Notes (Signed)
  Radiation Oncology         [phone] ________________________________  Name: Taylor Perez MRN: [id number]  Date: 09/27/2014  DOB: 09-26-1971  Simulation Verification Note  Status: outpatient  NARRATIVE: The patient was brought to the treatment unit and placed in the planned treatment position. The clinical setup was verified. Then port films were obtained and uploaded to the radiation oncology medical record software.  The treatment beams were carefully compared against the planned radiation fields. The position location and shape of the radiation fields was reviewed. The targeted volume of tissue appears appropriately covered by the radiation beams. Organs at risk appear to be excluded as planned.  Based on my personal review, I approved the simulation verification. The patient's treatment will proceed as planned.  ------------------------------------------------  Thea Silversmith, MD

## 2014-09-28 ENCOUNTER — Ambulatory Visit
Admission: RE | Admit: 2014-09-28 | Discharge: 2014-09-28 | Disposition: A | Discharge status: Home / Self Care | Payer: BLUE CROSS/BLUE SHIELD | Source: Ambulatory Visit | Attending: Radiation Oncology | Admitting: Radiation Oncology

## 2014-09-28 ENCOUNTER — Other Ambulatory Visit (HOSPITAL_BASED_OUTPATIENT_CLINIC_OR_DEPARTMENT_OTHER): Payer: BC Managed Care – PPO

## 2014-09-28 ENCOUNTER — Ambulatory Visit
Admission: RE | Admit: 2014-09-28 | Discharge: 2014-09-28 | Disposition: A | Discharge status: Home / Self Care | Payer: BC Managed Care – PPO | Source: Ambulatory Visit | Attending: Radiation Oncology | Admitting: Radiation Oncology

## 2014-09-28 VITALS — BP 107/66 | HR 68 | Temp 98.1°F | Wt 141.1 lb

## 2014-09-28 DIAGNOSIS — Z9013 Acquired absence of bilateral breasts and nipples: Secondary | ICD-10-CM | POA: Insufficient documentation

## 2014-09-28 DIAGNOSIS — C50911 Malignant neoplasm of unspecified site of right female breast: Secondary | ICD-10-CM | POA: Diagnosis not present

## 2014-09-28 DIAGNOSIS — Z510 Encounter for antineoplastic radiation therapy: Secondary | ICD-10-CM | POA: Diagnosis not present

## 2014-09-28 DIAGNOSIS — C50411 Malignant neoplasm of upper-outer quadrant of right female breast: Secondary | ICD-10-CM

## 2014-09-28 LAB — CBC WITH DIFFERENTIAL/PLATELET
BASO%: 1.1 % (ref 0.0–2.0)
BASOS ABS: 0 10*3/uL (ref 0.0–0.1)
EOS%: 7.2 % — AB (ref 0.0–7.0)
Eosinophils Absolute: 0.3 10*3/uL (ref 0.0–0.5)
HCT: 41.8 % (ref 34.8–46.6)
HGB: 13.6 g/dL (ref 11.6–15.9)
LYMPH#: 1.3 10*3/uL (ref 0.9–3.3)
LYMPH%: 32.6 % (ref 14.0–49.7)
MCH: 30 pg (ref 25.1–34.0)
MCHC: 32.5 g/dL (ref 31.5–36.0)
MCV: 92.2 fL (ref 79.5–101.0)
MONO#: 0.4 10*3/uL (ref 0.1–0.9)
MONO%: 9.8 % (ref 0.0–14.0)
NEUT#: 1.9 10*3/uL (ref 1.5–6.5)
NEUT%: 49.3 % (ref 38.4–76.8)
Platelets: 179 10*3/uL (ref 145–400)
RBC: 4.53 10*6/uL (ref 3.70–5.45)
RDW: 12.3 % (ref 11.2–14.5)
WBC: 3.9 10*3/uL (ref 3.9–10.3)

## 2014-09-28 LAB — COMPREHENSIVE METABOLIC PANEL (CC13)
ALBUMIN: 3.9 g/dL (ref 3.5–5.0)
ALK PHOS: 68 U/L (ref 40–150)
ALT: 18 U/L (ref 0–55)
ANION GAP: 9 meq/L (ref 3–11)
AST: 21 U/L (ref 5–34)
BUN: 8.8 mg/dL (ref 7.0–26.0)
CO2: 26 mEq/L (ref 22–29)
Calcium: 9.1 mg/dL (ref 8.4–10.4)
Chloride: 104 mEq/L (ref 98–109)
Creatinine: 0.9 mg/dL (ref 0.6–1.1)
EGFR: 81 mL/min/{1.73_m2} — AB (ref >90)
Glucose: 103 mg/dl (ref 70–140)
Potassium: 3.9 mEq/L (ref 3.5–5.1)
Sodium: 140 mEq/L (ref 136–145)
TOTAL PROTEIN: 7.2 g/dL (ref 6.4–8.3)
Total Bilirubin: 0.57 mg/dL (ref 0.20–1.20)

## 2014-09-28 MED ORDER — ALRA NON-METALLIC DEODORANT (RAD-ONC)
1.0000 "application " | Freq: Once | TOPICAL | Status: AC
  Administered 2014-09-28: 1 via TOPICAL

## 2014-09-28 MED ORDER — RADIAPLEXRX EX GEL
Freq: Once | CUTANEOUS | Status: AC
  Administered 2014-09-28: 15:00:00 via TOPICAL

## 2014-09-28 NOTE — Progress Notes (Signed)
Weekly Management Note Current Dose: 3.6  Gy  Projected Dose: 50.4 Gy   Narrative:  The patient presents for routine under treatment assessment.  CBCT/MVCT images/Port film x-rays were reviewed.  The chart was checked. Doing well. No complaints. RN education performed. Patient would prefer to use her own lotion with shea butter/vitamin e.   Physical Findings: Weight: 141 lb 1.6 oz (64.003 kg). Unchanged  Impression:  The patient is tolerating radiation.  Plan:  Continue treatment as planned. OK to use own lotion but not prior to tx.

## 2014-09-28 NOTE — Addendum Note (Signed)
Encounter addended by: Arlyss Repress, RN on: 09/28/2014  3:09 PM<BR>     Documentation filed: Inpatient MAR

## 2014-09-28 NOTE — Progress Notes (Signed)
Weekly assessment of radiation to right chest wall post bilateral mastectomy.Routine of clinic reviewed and gien Radiation therapy and You Booklet as well as alra deodorant and radiaplex.

## 2014-09-29 ENCOUNTER — Ambulatory Visit
Admission: RE | Admit: 2014-09-29 | Discharge: 2014-09-29 | Disposition: A | Discharge status: Home / Self Care | Payer: BLUE CROSS/BLUE SHIELD | Source: Ambulatory Visit | Attending: Radiation Oncology | Admitting: Radiation Oncology

## 2014-09-29 DIAGNOSIS — Z510 Encounter for antineoplastic radiation therapy: Secondary | ICD-10-CM | POA: Diagnosis not present

## 2014-09-29 LAB — FOLLICLE STIMULATING HORMONE: FSH: 20.4 m[IU]/mL

## 2014-09-30 ENCOUNTER — Ambulatory Visit
Admission: RE | Admit: 2014-09-30 | Discharge: 2014-09-30 | Disposition: A | Discharge status: Home / Self Care | Payer: BLUE CROSS/BLUE SHIELD | Source: Ambulatory Visit | Attending: Radiation Oncology | Admitting: Radiation Oncology

## 2014-09-30 DIAGNOSIS — Z510 Encounter for antineoplastic radiation therapy: Secondary | ICD-10-CM | POA: Diagnosis not present

## 2014-10-03 LAB — ESTRADIOL, ULTRA SENS: ESTRADIOL, ULTRA SENSITIVE: 205 pg/mL

## 2014-10-04 ENCOUNTER — Ambulatory Visit
Admission: RE | Admit: 2014-10-04 | Discharge: 2014-10-04 | Disposition: A | Discharge status: Home / Self Care | Payer: BLUE CROSS/BLUE SHIELD | Source: Ambulatory Visit | Attending: Radiation Oncology | Admitting: Radiation Oncology

## 2014-10-04 DIAGNOSIS — Z510 Encounter for antineoplastic radiation therapy: Secondary | ICD-10-CM | POA: Diagnosis not present

## 2014-10-05 ENCOUNTER — Ambulatory Visit
Admission: RE | Admit: 2014-10-05 | Discharge: 2014-10-05 | Disposition: A | Discharge status: Home / Self Care | Payer: BLUE CROSS/BLUE SHIELD | Source: Ambulatory Visit | Attending: Radiation Oncology | Admitting: Radiation Oncology

## 2014-10-05 ENCOUNTER — Ambulatory Visit (HOSPITAL_BASED_OUTPATIENT_CLINIC_OR_DEPARTMENT_OTHER): Payer: BLUE CROSS/BLUE SHIELD | Admitting: Oncology

## 2014-10-05 ENCOUNTER — Telehealth: Payer: Self-pay | Admitting: Oncology

## 2014-10-05 VITALS — BP 113/67 | HR 69 | Temp 97.8°F | Resp 18 | Ht 65.0 in | Wt 141.5 lb

## 2014-10-05 VITALS — BP 125/79 | HR 81 | Temp 98.5°F | Wt 142.4 lb

## 2014-10-05 DIAGNOSIS — Z510 Encounter for antineoplastic radiation therapy: Secondary | ICD-10-CM | POA: Diagnosis not present

## 2014-10-05 DIAGNOSIS — C50411 Malignant neoplasm of upper-outer quadrant of right female breast: Secondary | ICD-10-CM

## 2014-10-05 DIAGNOSIS — Z170 Estrogen receptor positive status [ER+]: Secondary | ICD-10-CM

## 2014-10-05 DIAGNOSIS — C50919 Malignant neoplasm of unspecified site of unspecified female breast: Secondary | ICD-10-CM

## 2014-10-05 NOTE — Progress Notes (Signed)
Weekly assessment of radiation to right chest wall .completed treatment 6.Skin is pink.No pain.Generalized fatigue.

## 2014-10-05 NOTE — Progress Notes (Signed)
[place]  Telephone:([phone]) [phone] [phone]     ID: Taylor Perez DOB: [birth year]  MR#: [id number]  JYN#:[id number]  Patient Care Team: Kathyrn Lass, MD as PCP - General (Family Medicine) Excell Seltzer, MD as Consulting Physician (General Surgery) Chauncey Cruel, MD as Consulting Physician (Oncology) Thea Silversmith, MD as Consulting Physician (Radiation Oncology)  CHIEF COMPLAINT: Estrogen receptor positive breast cancer  CURRENT TREATMENT: radiation, tamoxifen   BREAST CANCER HISTORY: From the original intake note:  "Taylor Perez" noted a change in her right breast sometime around February or March 2015. She waited to see whether it would resolve, but as it did not she eventually brought it to the attention of Dr. Sabra Heck, who confirmed the finding and set up the patient for bilateral diagnostic mammography and right breast ultrasonography at the breast Center 05/28/2014. Breast density was category D. There was an irregular mass with spiculations in the right breast upper-outer quadrant which was palpable and described as hard. Ultrasound confirmed an irregular spiculated hypoechoic mass occupying the entire upper outer quadrant of the right breast and measuring at least 4.5 cm. In the right axilla there was a lymph node which was normal in size but showed cortical nodularity and abnormal peripheral hypervascularity.  Accordingly on 06/04/2014 the patient underwent biopsy of the breast mass (the lymph node described from the prior ultrasound was found to have a large fatty hilum and symmetric cortex but a moderate size artery and vein draped over it and therefore it was not biopsied). The pathology from this procedure showed (SAA [id number]) and invasive ductal carcinoma, E-cadherin positive, estrogen receptor 91% positive, progesterone receptor 58% positive, both with moderate staining intensity, with an MIB-1 of 9%, and no HER-2 amplification, the signals ratio being  1.17 and the number per cell 1.70.  On 06/11/2014 the patient underwent bilateral breast MRI. This showed an area of architectural distortion an irregular clumped enhancement extending for a total of 7.4 cm in the right breast. He comes close to the skin of the anterior lateral right breast but no definite abnormal skin enhancement was noted. There were no findings suggestive of nipple involvement and no findings of the pectoralis involvement. The left breast and axillary lymph nodes are unremarkable. However there was a nonenhancing T2 increased signal along the left aspect of the lower thoracic vertebral body which was felt to require further evaluation.  The patient's subsequent history is as detailed below  INTERVAL HISTORY: Taylor Perez returns today for follow-up of her breast cancer. She is still undergoing radiation, and is generally tolerating that well. Her skin is just beginning to turn red she says. She still has fairly good energy. She continues to work full-time. The holidays were "fine".  REVIEW OF SYSTEMS: Taylor Perez continues to exercise daily. She feels a little dehydrated at times. She has gained a few pounds in the last few months (from 139 in September to 142 currently) and this concerns her. She wonders if her vitamin D is "where it supposed to be". She feels depressed, although this is not readily apparent in today's visit. She admits to significant stress she also worries that the stress might interact with her cancer in a negative way. A detailed review of systems today was otherwise noncontributory  PAST MEDICAL HISTORY: Past Medical History  Diagnosis Date  . Hashimoto's thyroiditis   . Vitamin D deficiency   . Anxiety   . Other isolated or specific phobias   . Stroke 2008    mini stroke  .  Asthma   . Pneumonia 2006  . Headache     sinus  . Arthritis     right arm and shoulder  . Cancer     breast  . Peripheral vascular disease     varicose veins  . Breast cancer      PAST SURGICAL HISTORY: Past Surgical History  Procedure Laterality Date  . Vein ligation and stripping    . Tubal ligation  1994  . Total mastectomy Bilateral 07/16/2014    WITH SENTINAL NODE BIOPSY  . Bilateral total mastectomy with axillary lymph node dissection Bilateral 07/16/2014    Procedure: BILATERAL TOTAL  MASTECTOMY WITH RIGHT  AXILLARY SENTINEL NODE BIOPSY, PROPHYLATIC LEFT BREAST Mastectomy;  Surgeon: Excell Seltzer, MD;  Location: [facility];  Service: General;  Laterality: Bilateral;  . Abdominal hysterectomy  1997    uterine prolapse    FAMILY HISTORY Family History  Problem Relation Age of Onset  . Breast cancer Paternal Aunt 39  . Ovarian cancer Paternal Grandmother 81  . Lung cancer Paternal Grandfather 42  . Breast cancer Paternal Aunt 33    reportedly BRCA negative.   . Depression Mother   . Pulmonary fibrosis Father   . Diabetes Father    The patient's father died from complications of pulmonary fibrosis at the age of 2. The patient's mother is living, age 36. The patient had one brother, no sisters. The patient's paternal grandmother had ovarian cancer. The patient's father had 3 sisters. 2 of them have been diagnosed with breast cancer, one in her 63s one in her 81s. The one in her 43s was tested for the BRCA genes and was negative.  GYNECOLOGIC HISTORY:  No LMP recorded. Patient has had a hysterectomy. Menarche age 31, first live birth age 39. The patient is GX P2. She had a hysterectomy in 1997. She is not on hormone replacement.  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 She is divorced. At home she lives with her mother, Taylor Perez, who semiretired. She has worked in Scientist, research (medical). The patient's son Taylor Perez is currently 25 attends [employer] where he is a Ship broker. The patient's other son died at the age of 76. The patient has no grandchildren. She is not a Scientist, research (life sciences).    ADVANCED DIRECTIVES: Not in place. At her 06/16/2014 visit the patient was given the appropriate documents to complete. She is considering naming her brother as her healthcare power of attorney.   HEALTH MAINTENANCE: History  Substance Use Topics  . Smoking status: Never Smoker   . Smokeless tobacco: Never Used  . Alcohol Use: Yes     Comment: not lately     Colonoscopy:  PAP:  Bone density:  Lipid panel:  Allergies  Allergen Reactions  . Demerol [Meperidine]     seizures  . Other Itching    Melon    Current Outpatient Prescriptions  Medication Sig Dispense Refill  . albuterol (PROVENTIL HFA;VENTOLIN HFA) 108 (90 BASE) MCG/ACT inhaler Inhale 1 puff into the lungs every 6 (six) hours as needed for wheezing or shortness of breath.    Marland Kitchen CALCIUM-MAGNESIUM-ZINC PO Take 1 tablet by mouth daily.    . Cats Claw, Uncaria tomentosa, (CATS CLAW PO) Take 1 drop by mouth daily.    . Cholecalciferol (VITAMIN D3 PO) Take 5,600 Int'l Units by mouth daily.    . Evening Primrose Oil 1000 MG CAPS Take by mouth.    Marland Kitchen  Flaxseed, Linseed, (FLAX SEED OIL PO) Take by mouth.    Nyoka Cowden Tea, Camillia sinensis, (GREEN TEA EXTRACT PO) Take 1 capsule by mouth daily.    . hyaluronate sodium (RADIAPLEXRX) GEL Apply 1 application topically 2 (two) times daily. Apply to breast after rad txs and at bedtime    . Iodine, Kelp, (KELP PO) Take 225 mcg by mouth daily.    Marland Kitchen KRILL OIL PO Take 2 tablets by mouth daily.    Marland Kitchen MELATONIN PO Take 1 tablet by mouth at bedtime.    . Menaquinone-7 (VITAMIN K2 PO) Take 1 capsule by mouth daily.    . Multiple Vitamin (MULTI-VITAMIN PO) Take 1 tablet by mouth daily.    . non-metallic deodorant Jethro Poling) MISC Apply 1 application topically daily as needed.    . OIL OF OREGANO PO Take 1 capsule by mouth daily.    . SELENIUM PO Take 200 mcg by mouth daily.    . TURMERIC PO Take 2 capsules by mouth daily.    Marland Kitchen VITAMIN E PO Take 1 tablet by mouth daily.     No current  facility-administered medications for this visit.    OBJECTIVE: Middle-aged white woman in no acute distress Filed Vitals:   10/05/14 1535  BP: 113/67  Pulse: 69  Temp: 97.8 F (36.6 C)  Resp: 18     Body mass index is 23.55 kg/(m^2).    ECOG FS:1 - Symptomatic but completely ambulatory  Sclerae unicteric, pupils round and equal Oropharynx clear, dentition is fine No cervical or supraclavicular adenopathy Lungs no rales or rhonchi Heart regular rate and rhythm Abd soft, nontender, positive bowel sounds MSK no focal spinal tenderness, no upper extremity lymphedema Neuro: nonfocal, well oriented, appropriate affect Breasts: status post bilateral mastectomies.there is no evidence of local recurrence. Both axillae are benign.  LAB RESULTS:  CMP     Component Value Date/Time   NA 140 09/28/2014 1551   NA 136* 07/17/2014 0524   K 3.9 09/28/2014 1551   K 4.0 07/17/2014 0524   CL 104 07/17/2014 0524   CO2 26 09/28/2014 1551   CO2 23 07/17/2014 0524   GLUCOSE 103 09/28/2014 1551   GLUCOSE 107* 07/17/2014 0524   BUN 8.8 09/28/2014 1551   BUN 6 07/17/2014 0524   CREATININE 0.9 09/28/2014 1551   CREATININE 0.72 07/17/2014 0524   CALCIUM 9.1 09/28/2014 1551   CALCIUM 8.1* 07/17/2014 0524   PROT 7.2 09/28/2014 1551   ALBUMIN 3.9 09/28/2014 1551   AST 21 09/28/2014 1551   ALT 18 09/28/2014 1551   ALKPHOS 68 09/28/2014 1551   BILITOT 0.57 09/28/2014 1551   GFRNONAA >90 07/17/2014 0524   GFRAA >90 07/17/2014 0524    I No results found for: SPEP  Lab Results  Component Value Date   WBC 3.9 09/28/2014   NEUTROABS 1.9 09/28/2014   HGB 13.6 09/28/2014   HCT 41.8 09/28/2014   MCV 92.2 09/28/2014   PLT 179 09/28/2014      Chemistry      Component Value Date/Time   NA 140 09/28/2014 1551   NA 136* 07/17/2014 0524   K 3.9 09/28/2014 1551   K 4.0 07/17/2014 0524   CL 104 07/17/2014 0524   CO2 26 09/28/2014 1551   CO2 23 07/17/2014 0524   BUN 8.8 09/28/2014 1551    BUN 6 07/17/2014 0524   CREATININE 0.9 09/28/2014 1551   CREATININE 0.72 07/17/2014 0524      Component Value Date/Time  CALCIUM 9.1 09/28/2014 1551   CALCIUM 8.1* 07/17/2014 0524   ALKPHOS 68 09/28/2014 1551   AST 21 09/28/2014 1551   ALT 18 09/28/2014 1551   BILITOT 0.57 09/28/2014 1551       No results found for: LABCA2  No components found for: LABCA125  No results for input(s): INR in the last 168 hours.  Urinalysis No results found for: COLORURINE  STUDIES: No results found.  ASSESSMENT: 44 y.o. BRCA negative [place] woman status post right breast upper-outer quadrant biopsy 06/04/2014 for a clinical T3 N0-1, stage II/III invasive ductal carcinoma, grade 2, estrogen receptor 91% positive, progesterone receptor 58% positive, with an MIB-1 of 9%, and no HER-2 amplification  (1) status post right mastectomy with sentinel lymph node sampling 07/16/2014 for an mpT3 pN0, stage IIA invasive ductal carcinoma, grade 2, with repeat HER-2 again negative. Margins were ample. Simple left mastectomy showed no evidence of malignancy  (2) Oncotype score of 16 predicts a risk of recurrence outside the breast over the next 10 years of 10% if the patient's only systemic therapy is tamoxifen for 5 years. It also predicts no benefit from chemotherapy.  (3) postmastectomy radiation to be completed 11/04/2014  (4) tamoxifen to follow radiation  (5) the patient is not planning on reconstruction  (6) OvaNext gene panel through [facility] reported October 2015, with no deleterious mutations.  PLAN: He will soon be done with her radiation in today we discussed antiestrogen options. She is status post hysterectomy, so we are following labs to tell where she is in the menopausal spectrum. Her Dargan was 2.5 in October 2015 and is 20.4 now. Her estradiol was 77 then and it is 205 now. Clearly she is not anywhere near menopause at this point and therefore an aromatase inhibitors are not an  option unless we also did ovarian suppression.  Accordingly we discussed tamoxifen. She has a good understanding of the possible toxicities, side effects and complications of this agent. I also gave her general information on how it interacts or does not interact with menopausal symptoms.  Though we could simply starte tamoxifen right after she finishes radiation, I would like to see her again in February and go over these issues again. I would like to clarify the motivation to take this drug so that she succeeds with it. Especially since she is status post hysterectomy that simplifies the side effect profile.   Taylor Perez has a good understanding of the overall plan. She agrees with it. She knows the goal of treatment in her case is cure. She will call with any problems that may develop before her next visit here.  Chauncey Cruel, MD   10/05/2014 3:53 PM

## 2014-10-05 NOTE — Telephone Encounter (Signed)
, °

## 2014-10-05 NOTE — Progress Notes (Signed)
Weekly Management Note Current Dose: 10.8  Gy  Projected Dose: 50.4 Gy   Narrative:  The patient presents for routine under treatment assessment.  CBCT/MVCT images/Port film x-rays were reviewed.  The chart was checked. Doing well. Had muscle spasm of her scalp during treatment one day. Appt with Mag[facility] this afternoon.   Physical Findings: Weight: 142 lb 6.4 oz (64.592 kg). Unchanged  Impression:  The patient is tolerating radiation.  Plan:  Continue treatment as planned. Continue radiaplex.

## 2014-10-06 ENCOUNTER — Ambulatory Visit
Admission: RE | Admit: 2014-10-06 | Discharge: 2014-10-06 | Disposition: A | Discharge status: Home / Self Care | Payer: BLUE CROSS/BLUE SHIELD | Source: Ambulatory Visit | Attending: Radiation Oncology | Admitting: Radiation Oncology

## 2014-10-06 DIAGNOSIS — Z510 Encounter for antineoplastic radiation therapy: Secondary | ICD-10-CM | POA: Diagnosis not present

## 2014-10-07 ENCOUNTER — Ambulatory Visit
Admission: RE | Admit: 2014-10-07 | Discharge: 2014-10-07 | Disposition: A | Discharge status: Home / Self Care | Payer: BLUE CROSS/BLUE SHIELD | Source: Ambulatory Visit | Attending: Radiation Oncology | Admitting: Radiation Oncology

## 2014-10-07 DIAGNOSIS — Z510 Encounter for antineoplastic radiation therapy: Secondary | ICD-10-CM | POA: Diagnosis not present

## 2014-10-08 ENCOUNTER — Ambulatory Visit
Admission: RE | Admit: 2014-10-08 | Discharge: 2014-10-08 | Disposition: A | Discharge status: Home / Self Care | Payer: BLUE CROSS/BLUE SHIELD | Source: Ambulatory Visit | Attending: Radiation Oncology | Admitting: Radiation Oncology

## 2014-10-08 DIAGNOSIS — Z510 Encounter for antineoplastic radiation therapy: Secondary | ICD-10-CM | POA: Diagnosis not present

## 2014-10-08 NOTE — Addendum Note (Signed)
Addended by: Laureen Abrahams on: 10/08/2014 06:24 PM   Modules accepted: Orders, Medications

## 2014-10-11 ENCOUNTER — Ambulatory Visit
Admission: RE | Admit: 2014-10-11 | Discharge: 2014-10-11 | Disposition: A | Discharge status: Home / Self Care | Payer: BLUE CROSS/BLUE SHIELD | Source: Ambulatory Visit | Attending: Radiation Oncology | Admitting: Radiation Oncology

## 2014-10-11 DIAGNOSIS — Z510 Encounter for antineoplastic radiation therapy: Secondary | ICD-10-CM | POA: Diagnosis not present

## 2014-10-12 ENCOUNTER — Ambulatory Visit
Admission: RE | Admit: 2014-10-12 | Discharge: 2014-10-12 | Disposition: A | Discharge status: Home / Self Care | Payer: BLUE CROSS/BLUE SHIELD | Source: Ambulatory Visit | Attending: Radiation Oncology | Admitting: Radiation Oncology

## 2014-10-12 VITALS — BP 109/71 | HR 88 | Temp 98.3°F | Wt 142.6 lb

## 2014-10-12 DIAGNOSIS — C50411 Malignant neoplasm of upper-outer quadrant of right female breast: Secondary | ICD-10-CM

## 2014-10-12 DIAGNOSIS — Z510 Encounter for antineoplastic radiation therapy: Secondary | ICD-10-CM | POA: Diagnosis not present

## 2014-10-12 NOTE — Progress Notes (Signed)
  Radiation Oncology         [phone] ________________________________  Name: Taylor Perez MRN: [id number]  Date: 10/12/2014  DOB: June 27, 1971  Weekly Radiation Therapy Management  Breast cancer of upper-outer quadrant of right female breast   Staging form: Breast, AJCC 7th Edition     Clinical: Stage IIB (T3, N0, cM0) - Unsigned       Staging comments: Staged at breast conference 06/16/14.        Current Dose: 19.8 Gy     Planned Dose:  50.4 Gy  Narrative . . . . . . . . The patient presents for routine under treatment assessment.                                   The patient is without complaint. She has noticed some slight itching in the upper right chest wall area.                                 Set-up films were reviewed.                                 The chart was checked. Physical Findings. . .  weight is 142 lb 9.6 oz (64.683 kg). Her temperature is 98.3 F (36.8 C). Her blood pressure is 109/71 and her pulse is 88. . The lungs are clear. The heart has a regular rhythm and rate. The right chest wall area shows some slight erythema and hyperpigmentation changes. Impression . . . . . . . The patient is tolerating radiation. Plan . . . . . . . . . . . . Continue treatment as planned.  ________________________________   Blair Promise, PhD, MD

## 2014-10-12 NOTE — Progress Notes (Signed)
Weekly assessment of radiation to right chest wall.Completed 11 of 28 fractions.Skin is pink.Denies pain.Generalized fatigue.

## 2014-10-13 ENCOUNTER — Ambulatory Visit
Admission: RE | Admit: 2014-10-13 | Discharge: 2014-10-13 | Disposition: A | Discharge status: Home / Self Care | Payer: BLUE CROSS/BLUE SHIELD | Source: Ambulatory Visit | Attending: Radiation Oncology | Admitting: Radiation Oncology

## 2014-10-13 DIAGNOSIS — Z510 Encounter for antineoplastic radiation therapy: Secondary | ICD-10-CM | POA: Diagnosis not present

## 2014-10-14 ENCOUNTER — Ambulatory Visit
Admission: RE | Admit: 2014-10-14 | Discharge: 2014-10-14 | Disposition: A | Discharge status: Home / Self Care | Payer: BLUE CROSS/BLUE SHIELD | Source: Ambulatory Visit | Attending: Radiation Oncology | Admitting: Radiation Oncology

## 2014-10-14 DIAGNOSIS — Z510 Encounter for antineoplastic radiation therapy: Secondary | ICD-10-CM | POA: Diagnosis not present

## 2014-10-15 ENCOUNTER — Ambulatory Visit
Admission: RE | Admit: 2014-10-15 | Discharge: 2014-10-15 | Disposition: A | Discharge status: Home / Self Care | Payer: BLUE CROSS/BLUE SHIELD | Source: Ambulatory Visit | Attending: Radiation Oncology | Admitting: Radiation Oncology

## 2014-10-15 DIAGNOSIS — Z510 Encounter for antineoplastic radiation therapy: Secondary | ICD-10-CM | POA: Diagnosis not present

## 2014-10-18 ENCOUNTER — Ambulatory Visit
Admission: RE | Admit: 2014-10-18 | Discharge: 2014-10-18 | Disposition: A | Discharge status: Home / Self Care | Payer: BLUE CROSS/BLUE SHIELD | Source: Ambulatory Visit | Attending: Radiation Oncology | Admitting: Radiation Oncology

## 2014-10-18 DIAGNOSIS — Z510 Encounter for antineoplastic radiation therapy: Secondary | ICD-10-CM | POA: Diagnosis not present

## 2014-10-19 ENCOUNTER — Ambulatory Visit
Admission: RE | Admit: 2014-10-19 | Discharge: 2014-10-19 | Disposition: A | Discharge status: Home / Self Care | Payer: BLUE CROSS/BLUE SHIELD | Source: Ambulatory Visit | Attending: Radiation Oncology | Admitting: Radiation Oncology

## 2014-10-19 ENCOUNTER — Encounter: Payer: Self-pay | Admitting: Radiation Oncology

## 2014-10-19 VITALS — BP 121/79 | HR 92 | Temp 97.9°F | Resp 20 | Wt 140.9 lb

## 2014-10-19 DIAGNOSIS — C50411 Malignant neoplasm of upper-outer quadrant of right female breast: Secondary | ICD-10-CM

## 2014-10-19 DIAGNOSIS — Z510 Encounter for antineoplastic radiation therapy: Secondary | ICD-10-CM | POA: Diagnosis not present

## 2014-10-19 NOTE — Progress Notes (Signed)
Weekly rad txs, right chestwall, 16 completd, erythema, skin intact, using radiaplex bid, c/o burning and soreness undr axilla and pain on rib area "Feel =slike I've been hit by a truck", very fatigued, just started, appetite good, no nausea, drinking plenty fluids 4:14 PM

## 2014-10-19 NOTE — Progress Notes (Signed)
Weekly Management Note Current Dose: 28.8  Gy  Projected Dose: 50.4 Gy   Narrative:  The patient presents for routine under treatment assessment.  CBCT/MVCT images/Port film x-rays were reviewed.  The chart was checked. Stress at work. Lots of fatigue. Doing yoga weekly. Pain "like tearing" over her right chest wall.   Physical Findings: Weight: 140 lb 14.4 oz (63.912 kg). Skin irritation over right chest wall.   Impression:  The patient is tolerating radiation.  Plan:  Continue treatment as planned. Discussed ABC class. Modify yoga on right side to prevent stretching/tearing during RT. Discussed job and stress. She will continue her own skin care.

## 2014-10-20 ENCOUNTER — Ambulatory Visit
Admission: RE | Admit: 2014-10-20 | Discharge: 2014-10-20 | Disposition: A | Discharge status: Home / Self Care | Payer: BLUE CROSS/BLUE SHIELD | Source: Ambulatory Visit | Attending: Radiation Oncology | Admitting: Radiation Oncology

## 2014-10-20 DIAGNOSIS — Z510 Encounter for antineoplastic radiation therapy: Secondary | ICD-10-CM | POA: Diagnosis not present

## 2014-10-21 ENCOUNTER — Ambulatory Visit
Admission: RE | Admit: 2014-10-21 | Discharge: 2014-10-21 | Disposition: A | Discharge status: Home / Self Care | Payer: BLUE CROSS/BLUE SHIELD | Source: Ambulatory Visit | Attending: Radiation Oncology | Admitting: Radiation Oncology

## 2014-10-21 DIAGNOSIS — Z510 Encounter for antineoplastic radiation therapy: Secondary | ICD-10-CM | POA: Diagnosis not present

## 2014-10-22 ENCOUNTER — Ambulatory Visit: Payer: BLUE CROSS/BLUE SHIELD

## 2014-10-25 ENCOUNTER — Ambulatory Visit
Admission: RE | Admit: 2014-10-25 | Discharge: 2014-10-25 | Disposition: A | Discharge status: Home / Self Care | Payer: BLUE CROSS/BLUE SHIELD | Source: Ambulatory Visit | Attending: Radiation Oncology | Admitting: Radiation Oncology

## 2014-10-25 DIAGNOSIS — Z510 Encounter for antineoplastic radiation therapy: Secondary | ICD-10-CM | POA: Diagnosis not present

## 2014-10-26 ENCOUNTER — Ambulatory Visit
Admission: RE | Admit: 2014-10-26 | Discharge: 2014-10-26 | Disposition: A | Discharge status: Home / Self Care | Payer: BLUE CROSS/BLUE SHIELD | Source: Ambulatory Visit | Attending: Radiation Oncology | Admitting: Radiation Oncology

## 2014-10-26 VITALS — BP 121/80 | HR 66 | Temp 98.1°F | Wt 141.7 lb

## 2014-10-26 DIAGNOSIS — C50411 Malignant neoplasm of upper-outer quadrant of right female breast: Secondary | ICD-10-CM

## 2014-10-26 DIAGNOSIS — Z510 Encounter for antineoplastic radiation therapy: Secondary | ICD-10-CM | POA: Diagnosis not present

## 2014-10-26 NOTE — Progress Notes (Signed)
Completed 20 of 28 treatments to right chest wall..Moderate redness with follicular rash.continue application of hydrocortisone and radiaplex.Generalized fatigue.

## 2014-10-26 NOTE — Progress Notes (Signed)
Weekly Management Note Current Dose:  36 Gy  Projected Dose: 50.4 Gy   Narrative:  The patient presents for routine under treatment assessment.  CBCT/MVCT images/Port film x-rays were reviewed.  The chart was checked. Emotionally better today. Skin itching at superior medial aspect. Using hydrocortisone.   Physical Findings: Weight: 141 lb 11.2 oz (64.275 kg). Pink chest wall. Dermatitis medially.   Impression:  The patient is tolerating radiation.  Plan:  Continue treatment as planned. Continue hydrocortisone prn.

## 2014-10-27 ENCOUNTER — Ambulatory Visit
Admission: RE | Admit: 2014-10-27 | Discharge: 2014-10-27 | Disposition: A | Discharge status: Home / Self Care | Payer: BLUE CROSS/BLUE SHIELD | Source: Ambulatory Visit | Attending: Radiation Oncology | Admitting: Radiation Oncology

## 2014-10-27 DIAGNOSIS — Z510 Encounter for antineoplastic radiation therapy: Secondary | ICD-10-CM | POA: Diagnosis not present

## 2014-10-28 ENCOUNTER — Ambulatory Visit
Admission: RE | Admit: 2014-10-28 | Discharge: 2014-10-28 | Disposition: A | Discharge status: Home / Self Care | Payer: BLUE CROSS/BLUE SHIELD | Source: Ambulatory Visit | Attending: Radiation Oncology | Admitting: Radiation Oncology

## 2014-10-28 DIAGNOSIS — Z510 Encounter for antineoplastic radiation therapy: Secondary | ICD-10-CM | POA: Diagnosis not present

## 2014-10-29 ENCOUNTER — Ambulatory Visit
Admission: RE | Admit: 2014-10-29 | Discharge: 2014-10-29 | Disposition: A | Discharge status: Home / Self Care | Payer: BLUE CROSS/BLUE SHIELD | Source: Ambulatory Visit | Attending: Radiation Oncology | Admitting: Radiation Oncology

## 2014-10-29 DIAGNOSIS — Z510 Encounter for antineoplastic radiation therapy: Secondary | ICD-10-CM | POA: Diagnosis not present

## 2014-11-01 ENCOUNTER — Ambulatory Visit
Admission: RE | Admit: 2014-11-01 | Discharge: 2014-11-01 | Disposition: A | Discharge status: Home / Self Care | Payer: BLUE CROSS/BLUE SHIELD | Source: Ambulatory Visit | Attending: Radiation Oncology | Admitting: Radiation Oncology

## 2014-11-01 DIAGNOSIS — Z510 Encounter for antineoplastic radiation therapy: Secondary | ICD-10-CM | POA: Diagnosis not present

## 2014-11-02 ENCOUNTER — Ambulatory Visit
Admission: RE | Admit: 2014-11-02 | Discharge: 2014-11-02 | Disposition: A | Discharge status: Home / Self Care | Payer: BLUE CROSS/BLUE SHIELD | Source: Ambulatory Visit | Attending: Radiation Oncology | Admitting: Radiation Oncology

## 2014-11-02 ENCOUNTER — Encounter: Payer: Self-pay | Admitting: Radiation Oncology

## 2014-11-02 DIAGNOSIS — Z510 Encounter for antineoplastic radiation therapy: Secondary | ICD-10-CM | POA: Diagnosis not present

## 2014-11-02 NOTE — Progress Notes (Signed)
Taylor Perez has received 25 fractions to her right chest wall.  Note bright erythema with radiation dermatitis in the upper, inner portion of the field.  She reports level 4/10 pain and tenderness in this area.  Applying Radiaplex Gel 3 or more times daily.  She works full-time and admits to fatigue.

## 2014-11-03 ENCOUNTER — Ambulatory Visit
Admission: RE | Admit: 2014-11-03 | Discharge: 2014-11-03 | Disposition: A | Discharge status: Home / Self Care | Payer: BLUE CROSS/BLUE SHIELD | Source: Ambulatory Visit | Attending: Radiation Oncology | Admitting: Radiation Oncology

## 2014-11-03 ENCOUNTER — Encounter: Payer: Self-pay | Admitting: Radiation Oncology

## 2014-11-03 DIAGNOSIS — Z510 Encounter for antineoplastic radiation therapy: Secondary | ICD-10-CM | POA: Diagnosis not present

## 2014-11-03 NOTE — Progress Notes (Signed)
Weekly Management Note (seen on 11/02/14) Current Dose: 45  Gy  Projected Dose: 50.4 Gy   Narrative:  The patient presents for routine under treatment assessment.  CBCT/MVCT images/Port film x-rays were reviewed.  The chart was checked. Doing well. More itching medially. Using aloe and hydrocortisone. Fatigue continues. Has appt with med onc to discuss antiestrogen  Physical Findings: Weight:  . Unchanged. Pink skin over left chest wall. Some dermatitis medially.   Impression:  The patient is tolerating radiation.  Plan:  Continue treatment as planned. Discussed lotion with vitamin E. Discussed follow up in 1 month. Encouraged her to consider AI or tamoxifen.

## 2014-11-04 ENCOUNTER — Ambulatory Visit: Payer: BLUE CROSS/BLUE SHIELD

## 2014-11-04 ENCOUNTER — Ambulatory Visit
Admission: RE | Admit: 2014-11-04 | Discharge: 2014-11-04 | Disposition: A | Discharge status: Home / Self Care | Payer: BLUE CROSS/BLUE SHIELD | Source: Ambulatory Visit | Attending: Radiation Oncology | Admitting: Radiation Oncology

## 2014-11-04 DIAGNOSIS — Z510 Encounter for antineoplastic radiation therapy: Secondary | ICD-10-CM | POA: Diagnosis not present

## 2014-11-05 ENCOUNTER — Ambulatory Visit
Admission: RE | Admit: 2014-11-05 | Discharge: 2014-11-05 | Disposition: A | Discharge status: Home / Self Care | Payer: BLUE CROSS/BLUE SHIELD | Source: Ambulatory Visit | Attending: Radiation Oncology | Admitting: Radiation Oncology

## 2014-11-05 ENCOUNTER — Ambulatory Visit: Payer: BLUE CROSS/BLUE SHIELD

## 2014-11-05 ENCOUNTER — Encounter: Payer: Self-pay | Admitting: Radiation Oncology

## 2014-11-05 DIAGNOSIS — Z510 Encounter for antineoplastic radiation therapy: Secondary | ICD-10-CM | POA: Diagnosis not present

## 2014-11-08 NOTE — Progress Notes (Signed)
°  Radiation Oncology         [phone] ________________________________  Name: Taylor Perez MRN: [id number]  Date: 11/05/2014  DOB: 03/17/1971  End of Treatment Note  Diagnosis:   T3N0 Invasive Ductal Carcinoma  Indication for treatment:  Curative       Radiation treatment dates:  09/27/2014-11/05/2014   Site/dose:  Site/dose:    Right breast / 50.4 Gray @ 1.8 Pearline Cables per fraction x 28 fractions  Beams/energy:  Opposed Tangents / 6 MV photons  Narrative: The patient tolerated radiation treatment relatively well.   She had some fatigue with treatment. Her skin was erythematous and dry at the end of treatment.   Plan: The patient has completed radiation treatment. The patient will return to radiation oncology clinic for routine followup in one month. I advised them to call or return sooner if they have any questions or concerns related to their recovery or treatment.  ------------------------------------------------  Thea Silversmith, MD 0.4

## 2014-11-11 ENCOUNTER — Ambulatory Visit: Payer: BLUE CROSS/BLUE SHIELD

## 2014-11-16 ENCOUNTER — Ambulatory Visit (HOSPITAL_BASED_OUTPATIENT_CLINIC_OR_DEPARTMENT_OTHER): Payer: BLUE CROSS/BLUE SHIELD

## 2014-11-16 ENCOUNTER — Other Ambulatory Visit: Payer: BLUE CROSS/BLUE SHIELD

## 2014-11-16 ENCOUNTER — Telehealth: Payer: Self-pay | Admitting: Oncology

## 2014-11-16 DIAGNOSIS — C50411 Malignant neoplasm of upper-outer quadrant of right female breast: Secondary | ICD-10-CM

## 2014-11-16 DIAGNOSIS — C50919 Malignant neoplasm of unspecified site of unspecified female breast: Secondary | ICD-10-CM

## 2014-11-16 LAB — CBC WITH DIFFERENTIAL/PLATELET
BASO%: 0.9 % (ref 0.0–2.0)
Basophils Absolute: 0 10*3/uL (ref 0.0–0.1)
EOS ABS: 0.1 10*3/uL (ref 0.0–0.5)
EOS%: 1.6 % (ref 0.0–7.0)
HCT: 41.4 % (ref 34.8–46.6)
HGB: 13.6 g/dL (ref 11.6–15.9)
LYMPH%: 17.4 % (ref 14.0–49.7)
MCH: 29.7 pg (ref 25.1–34.0)
MCHC: 32.8 g/dL (ref 31.5–36.0)
MCV: 90.4 fL (ref 79.5–101.0)
MONO#: 0.5 10*3/uL (ref 0.1–0.9)
MONO%: 11.4 % (ref 0.0–14.0)
NEUT%: 68.7 % (ref 38.4–76.8)
NEUTROS ABS: 3.1 10*3/uL (ref 1.5–6.5)
PLATELETS: 170 10*3/uL (ref 145–400)
RBC: 4.58 10*6/uL (ref 3.70–5.45)
RDW: 12.5 % (ref 11.2–14.5)
WBC: 4.5 10*3/uL (ref 3.9–10.3)
lymph#: 0.8 10*3/uL — ABNORMAL LOW (ref 0.9–3.3)

## 2014-11-16 LAB — COMPREHENSIVE METABOLIC PANEL (CC13)
ALT: 17 U/L (ref 0–55)
ANION GAP: 12 meq/L — AB (ref 3–11)
AST: 20 U/L (ref 5–34)
Albumin: 3.9 g/dL (ref 3.5–5.0)
Alkaline Phosphatase: 58 U/L (ref 40–150)
BUN: 14.4 mg/dL (ref 7.0–26.0)
CALCIUM: 9.2 mg/dL (ref 8.4–10.4)
CO2: 20 meq/L — AB (ref 22–29)
Chloride: 108 mEq/L (ref 98–109)
Creatinine: 0.8 mg/dL (ref 0.6–1.1)
EGFR: >90 mL/min/{1.73_m2} (ref >90)
Glucose: 94 mg/dl (ref 70–140)
POTASSIUM: 3.8 meq/L (ref 3.5–5.1)
Sodium: 139 mEq/L (ref 136–145)
Total Bilirubin: 0.41 mg/dL (ref 0.20–1.20)
Total Protein: 7.1 g/dL (ref 6.4–8.3)

## 2014-11-16 NOTE — Telephone Encounter (Signed)
pt appt go cx r/s and pt going to lab

## 2014-11-17 LAB — VITAMIN D 25 HYDROXY (VIT D DEFICIENCY, FRACTURES): Vit D, 25-Hydroxy: 41 ng/mL (ref 30–100)

## 2014-11-26 ENCOUNTER — Other Ambulatory Visit: Payer: BLUE CROSS/BLUE SHIELD

## 2014-11-26 ENCOUNTER — Ambulatory Visit: Payer: BLUE CROSS/BLUE SHIELD | Admitting: Oncology

## 2014-12-02 ENCOUNTER — Telehealth: Payer: Self-pay | Admitting: Oncology

## 2014-12-02 ENCOUNTER — Ambulatory Visit (HOSPITAL_BASED_OUTPATIENT_CLINIC_OR_DEPARTMENT_OTHER): Payer: BLUE CROSS/BLUE SHIELD | Admitting: Oncology

## 2014-12-02 VITALS — BP 125/81 | HR 70 | Temp 98.2°F | Resp 18 | Ht 65.0 in | Wt 140.1 lb

## 2014-12-02 DIAGNOSIS — Z170 Estrogen receptor positive status [ER+]: Secondary | ICD-10-CM

## 2014-12-02 DIAGNOSIS — C50411 Malignant neoplasm of upper-outer quadrant of right female breast: Secondary | ICD-10-CM

## 2014-12-02 MED ORDER — TAMOXIFEN CITRATE 20 MG PO TABS: 20.0000 mg | ORAL_TABLET | Freq: Every day | ORAL | Status: AC

## 2014-12-02 NOTE — Progress Notes (Signed)
[facility]  [phone] [phone]     ID: Taylor Perez DOB: 06/28/1971  MR#: [id number]  CSN#:[id number]  Patient Care Team: Lisa Miller, MD as PCP - General (Family Medicine) Benjamin Hoxworth, MD as Consulting Physician (General Surgery) Gustav C Magrinat, MD as Consulting Physician (Oncology) Stacy Wentworth, MD as Consulting Physician (Radiation Oncology)  CHIEF COMPLAINT: Estrogen receptor positive breast cancer  CURRENT TREATMENT: tamoxifen   BREAST CANCER HISTORY: From the original intake note:  "Taylor Perez" noted a change in her right breast sometime around February or March 2015. She waited to see whether it would resolve, but as it did not she eventually brought it to the attention of Dr. Miller, who confirmed the finding and set up the patient for bilateral diagnostic mammography and right breast ultrasonography at the [facility] Center 05/28/2014. Breast density was category D. There was an irregular mass with spiculations in the right breast upper-outer quadrant which was palpable and described as hard. Ultrasound confirmed an irregular spiculated hypoechoic mass occupying the entire upper outer quadrant of the right breast and measuring at least 4.5 cm. In the right axilla there was a lymph node which was normal in size but showed cortical nodularity and abnormal peripheral hypervascularity.  Accordingly on 06/04/2014 the patient underwent biopsy of the breast mass (the lymph node described from the prior ultrasound was found to have a large fatty hilum and symmetric cortex but a moderate size artery and vein draped over it and therefore it was not biopsied). The pathology from this procedure showed ([id number]) and invasive ductal carcinoma, E-cadherin positive, estrogen receptor 91% positive, progesterone receptor 58% positive, both with moderate staining intensity, with an MIB-1 of 9%, and no HER-2 amplification, the signals ratio being 1.17 and  the number per cell 1.70.  On 06/11/2014 the patient underwent bilateral breast MRI. This showed an area of architectural distortion an irregular clumped enhancement extending for a total of 7.4 cm in the right breast. He comes close to the skin of the anterior lateral right breast but no definite abnormal skin enhancement was noted. There were no findings suggestive of nipple involvement and no findings of the pectoralis involvement. The left breast and axillary lymph nodes are unremarkable. However there was a nonenhancing T2 increased signal along the left aspect of the lower thoracic vertebral body which was felt to require further evaluation.  The patient's subsequent history is as detailed below  INTERVAL HISTORY: Taylor Perez returns today for follow-up of her breast cancer. Since her last visit here she completed her radiation treatments. She feels overall they were "fine", but did develop some dermatitis, which is now largely resolved. She tells me her range of motion is good but with certain yoga opposes it can be a little tight or even a little sore. She has not yet recovered her energy, but she is walking 3 or 4 times a week in addition to a yoga. She is going to bed a little earlier than she used to and sleeps fine through the night.  REVIEW OF SYSTEMS: Taylor Perez is still premenopausal, so she is not having hot flashes, night sweats, or vaginal dryness problems. She is under great deal of stress currently not only because of the cancer diagnosis but because of problems that work. She tells me she has trouble concentrating. She admits to some depression. A detailed review of systems today was otherwise noncontributory  PAST MEDICAL HISTORY: Past Medical History  Diagnosis Date  . Hashimoto's thyroiditis   . Vitamin   D deficiency   . Anxiety   . Other isolated or specific phobias   . Stroke 2008    mini stroke  . Asthma   . Pneumonia 2006  . Headache     sinus  . Arthritis     right arm and  shoulder  . Cancer     breast  . Peripheral vascular disease     varicose veins  . Breast cancer     PAST SURGICAL HISTORY: Past Surgical History  Procedure Laterality Date  . Vein ligation and stripping    . Tubal ligation  1994  . Total mastectomy Bilateral 07/16/2014    WITH SENTINAL NODE BIOPSY  . Bilateral total mastectomy with axillary lymph node dissection Bilateral 07/16/2014    Procedure: BILATERAL TOTAL  MASTECTOMY WITH RIGHT  AXILLARY SENTINEL NODE BIOPSY, PROPHYLATIC LEFT BREAST Mastectomy;  Surgeon: Benjamin Hoxworth, MD;  Location: [facility] OR;  Service: General;  Laterality: Bilateral;  . Abdominal hysterectomy  1997    uterine prolapse    FAMILY HISTORY Family History  Problem Relation Age of Onset  . Breast cancer Paternal Aunt 45  . Ovarian cancer Paternal Grandmother 65  . Lung cancer Paternal Grandfather 65  . Breast cancer Paternal Aunt 75    reportedly BRCA negative.   . Depression Mother   . Pulmonary fibrosis Father   . Diabetes Father    The patient's father died from complications of pulmonary fibrosis at the age of 65. The patient's mother is living, age 65. The patient had one brother, no sisters. The patient's paternal grandmother had ovarian cancer. The patient's father had 3 sisters. 2 of them have been diagnosed with breast cancer, one in her 40s one in her 70s. The one in her 70s was tested for the BRCA genes and was negative.  GYNECOLOGIC HISTORY:  No LMP recorded. Patient has had a hysterectomy. Menarche age 10, first live birth age 18. The patient is GX P2. She had a hysterectomy in 1997. She is not on hormone replacement.  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 She is divorced. At home she lives with her mother, Judy Schaner, who semiretired. She has worked in retail. The patient's son Zachary Briley is currently 24 attends [employer] where he is a student. The patient's other  son died at the age of 19. The patient has no grandchildren. She is not a church attender.    ADVANCED DIRECTIVES: Not in place. At her 06/16/2014 visit the patient was given the appropriate documents to complete. She is considering naming her brother as her healthcare power of attorney.   HEALTH MAINTENANCE: History  Substance Use Topics  . Smoking status: Never Smoker   . Smokeless tobacco: Never Used  . Alcohol Use: Yes     Comment: not lately     Colonoscopy:  PAP:  Bone density:  Lipid panel:  Allergies  Allergen Reactions  . Demerol [Meperidine]     seizures  . Other Itching    Melon    Current Outpatient Prescriptions  Medication Sig Dispense Refill  . albuterol (PROVENTIL HFA;VENTOLIN HFA) 108 (90 BASE) MCG/ACT inhaler Inhale 1 puff into the lungs every 6 (six) hours as needed for wheezing or shortness of breath.    . CALCIUM-MAGNESIUM-ZINC PO Take 1 tablet by mouth daily.    . Cats Claw, Uncaria tomentosa, (CATS CLAW PO) Take 1 drop by mouth daily.    . Cholecalciferol (VITAMIN   D3 PO) Take 5,600 Int'l Units by mouth daily.    . Evening Primrose Oil 1000 MG CAPS Take by mouth.    . Flaxseed, Linseed, (FLAX SEED OIL PO) Take by mouth.    . Green Tea, Camillia sinensis, (GREEN TEA EXTRACT PO) Take 1 capsule by mouth daily.    . hyaluronate sodium (RADIAPLEXRX) GEL Apply 1 application topically 2 (two) times daily. Apply to breast after rad txs and at bedtime    . Iodine, Kelp, (KELP PO) Take 225 mcg by mouth daily.    . Menaquinone-7 (VITAMIN K2 PO) Take 1 capsule by mouth daily.    . Multiple Vitamin (MULTI-VITAMIN PO) Take 1 tablet by mouth daily.    . non-metallic deodorant (ALRA) MISC Apply 1 application topically daily as needed.    . OIL OF OREGANO PO Take 1 capsule by mouth daily.    . SELENIUM PO Take 200 mcg by mouth daily.    . TURMERIC PO Take 2 capsules by mouth daily.    . VITAMIN E PO Take 1 tablet by mouth daily.     No current facility-administered  medications for this visit.    OBJECTIVE: Middle-aged white woman who appears stated age Filed Vitals:   12/02/14 1046  BP: 125/81  Pulse: 70  Temp: 98.2 F (36.8 C)  Resp: 18     Body mass index is 23.31 kg/(m^2).    ECOG FS:1 - Symptomatic but completely ambulatory  Sclerae unicteric, EOMs intact Oropharynx clear and moist No cervical or supraclavicular adenopathy Lungs no rales or rhonchi Heart regular rate and rhythm Abd soft, nontender, positive bowel sounds MSK no focal spinal tenderness, no upper extremity lymphedema Neuro: nonfocal, well oriented, guarded affect Breasts: status post bilateral mastectomies. On the right there is mild residual erythema over the radiation port. There is no evidence of chest wall recurrence. Both axillae are benign.  LAB RESULTS:  CMP     Component Value Date/Time   NA 139 11/16/2014 1556   NA 136* 07/17/2014 0524   K 3.8 11/16/2014 1556   K 4.0 07/17/2014 0524   CL 104 07/17/2014 0524   CO2 20* 11/16/2014 1556   CO2 23 07/17/2014 0524   GLUCOSE 94 11/16/2014 1556   GLUCOSE 107* 07/17/2014 0524   BUN 14.4 11/16/2014 1556   BUN 6 07/17/2014 0524   CREATININE 0.8 11/16/2014 1556   CREATININE 0.72 07/17/2014 0524   CALCIUM 9.2 11/16/2014 1556   CALCIUM 8.1* 07/17/2014 0524   PROT 7.1 11/16/2014 1556   ALBUMIN 3.9 11/16/2014 1556   AST 20 11/16/2014 1556   ALT 17 11/16/2014 1556   ALKPHOS 58 11/16/2014 1556   BILITOT 0.41 11/16/2014 1556   GFRNONAA >90 07/17/2014 0524   GFRAA >90 07/17/2014 0524    I No results found for: SPEP  Lab Results  Component Value Date   WBC 4.5 11/16/2014   NEUTROABS 3.1 11/16/2014   HGB 13.6 11/16/2014   HCT 41.4 11/16/2014   MCV 90.4 11/16/2014   PLT 170 11/16/2014      Chemistry      Component Value Date/Time   NA 139 11/16/2014 1556   NA 136* 07/17/2014 0524   K 3.8 11/16/2014 1556   K 4.0 07/17/2014 0524   CL 104 07/17/2014 0524   CO2 20* 11/16/2014 1556   CO2 23 07/17/2014  0524   BUN 14.4 11/16/2014 1556   BUN 6 07/17/2014 0524   CREATININE 0.8 11/16/2014 1556   CREATININE 0.72 07/17/2014   0524      Component Value Date/Time   CALCIUM 9.2 11/16/2014 1556   CALCIUM 8.1* 07/17/2014 0524   ALKPHOS 58 11/16/2014 1556   AST 20 11/16/2014 1556   ALT 17 11/16/2014 1556   BILITOT 0.41 11/16/2014 1556       No results found for: LABCA2  No components found for: LABCA125  No results for input(s): INR in the last 168 hours.  Urinalysis No results found for: COLORURINE   Results for Perez, Taylor (MRN [id number]) as of 12/02/2014 10:50  Ref. Range 07/22/2014 08:09 09/28/2014 15:50  Estradiol, Ultra Sensitive Latest Units: pg/mL 77 205   STUDIES: No results found.  ASSESSMENT: 43 y.o. BRCA negative [place] woman status post right breast upper-outer quadrant biopsy 06/04/2014 for a clinical T3 N0-1, stage II/III invasive ductal carcinoma, grade 2, estrogen receptor 91% positive, progesterone receptor 58% positive, with an MIB-1 of 9%, and no HER-2 amplification  (1) status post right mastectomy with sentinel lymph node sampling 07/16/2014 for an mpT3 pN0, stage IIA invasive ductal carcinoma, grade 2, with repeat HER-2 again negative. Margins were ample. Simple left mastectomy showed no evidence of malignancy  (2) Oncotype score of 16 predicts a risk of recurrence outside the breast over the next 10 years of 10% if the patient's only systemic therapy is tamoxifen for 5 years. It also predicts no benefit from chemotherapy.  (3) postmastectomy radiation completed 11/05/2014: Right breast / 50.4 Gray @ 1.8 Gray per fraction x 28 fractions  (4) tamoxifen to start 12/31/2014  (5) the patient is not planning on reconstruction  (6) OvaNext gene panel through [facility] reported October 2015, with no deleterious mutations.  PLAN: Taylor Perez did well with the radiation, but she still in the healing process and also is experiencing quite a bit of stress relating  to her work. She just does not feel that she is ready to start tamoxifen at this point. I think this is reasonable. Our target date to start tamoxifen is 12/31/2014.  We did review her Oncotype results, which predict a 10% risk of this cancer recurring in her bones, liver, or lung or elsewhere outside the breast if she takes tamoxifen for 5 years. If she does not take any antiestrogen the risk probably is approximately 20% and that is what I quoted her today.  We then discussed the possible toxicities, side effects and complications of tamoxifen. In her case the fact that she is status post hysterectomy removes the concerns regarding endometrial polyps or endometrial cancer. She is aware of concerns regarding blood clots, the risk of that occurring being similar to oral contraceptives or hormone replacement.  I think she is having some posttraumatic stress symptoms. We discussed venlafaxine, which I think might be helpful to her for the next few months as she goes through these transitions in her life. At this point she is not interested in adding another medication as she puts it.  I suggested she consider the finding your new normal group. She will give that some thought. Otherwise she will see me again in June. If she is tolerating tamoxifen well, the plan will be to continue that until she is postmenopausal. If she cannot tolerate the tamoxifen and we will consider ovarian suppression.  MAGRINAT,GUSTAV C, MD   12/02/2014 10:50 AM 

## 2014-12-02 NOTE — Telephone Encounter (Signed)
per pof ot sch pt appt-gave pt copy of sch °

## 2015-03-17 ENCOUNTER — Other Ambulatory Visit: Payer: BLUE CROSS/BLUE SHIELD

## 2015-03-24 ENCOUNTER — Ambulatory Visit: Payer: BLUE CROSS/BLUE SHIELD | Admitting: Oncology

## 2015-03-24 ENCOUNTER — Other Ambulatory Visit: Payer: BLUE CROSS/BLUE SHIELD

## 2015-08-05 ENCOUNTER — Encounter: Payer: Self-pay | Admitting: Genetic Counselor

## 2015-08-05 DIAGNOSIS — Z1379 Encounter for other screening for genetic and chromosomal anomalies: Secondary | ICD-10-CM | POA: Insufficient documentation

## 2016-04-14 IMAGING — MR MR HEAD WO/W CM
10 of 13 series · 33 of 48 positions shown · IV contrast (Yes)
Comparison: None available.

CLINICAL DATA: Breast cancer, severe headaches, evaluate for
metastatic disease.

EXAM:
MRI HEAD WITHOUT AND WITH CONTRAST
TECHNIQUE: Multiplanar, multiecho pulse sequences of the brain and surrounding
structures were obtained without and with intravenous contrast.
CONTRAST:  12mL MULTIHANCE GADOBENATE DIMEGLUMINE 529 MG/ML IV SOLN

[Series 3: T1 · sagittal · 5.0mm · 0.47mm/px · 1 of 24 slices shown]
[im 1/24]
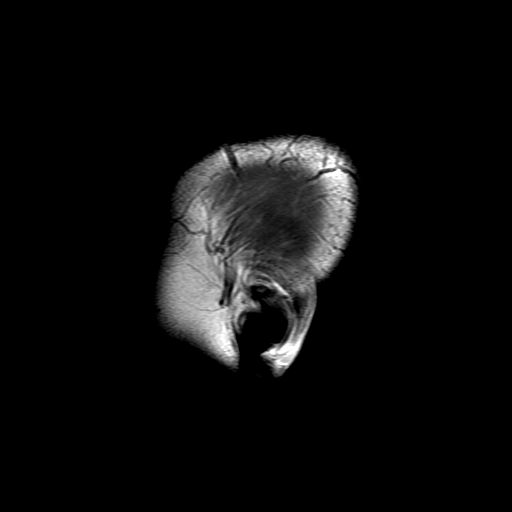

[Series 4: DWI · axial · 5.0mm · 1.09mm/px · z∈[-77,+60]mm · 6 of 58 slices shown (1 of 4)]
[im 1/58]
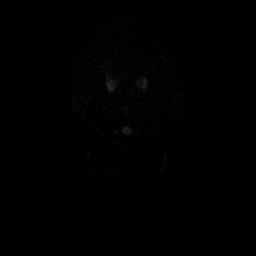
[im 12/58]
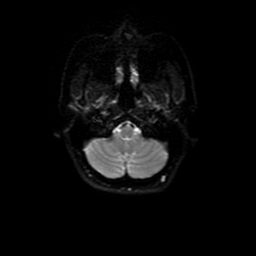
[im 23/58]
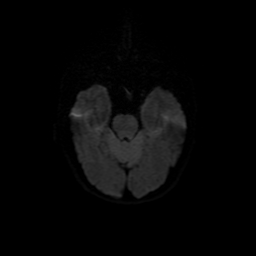
[im 35/58]
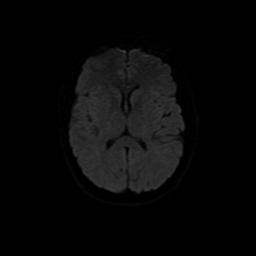
[im 46/58]
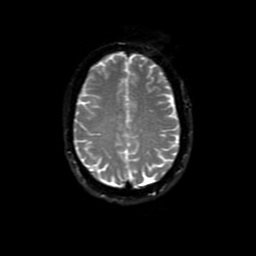
[im 58/58]
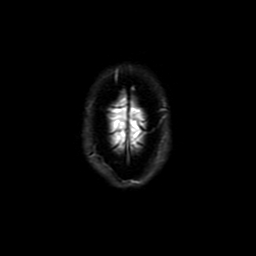

[Series 5: DWI · coronal · 5.0mm · 1.09mm/px · 7 of 72 slices shown (2 of 4)]
[im 1/72]
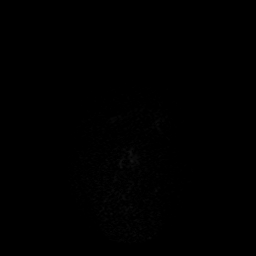
[im 12/72]
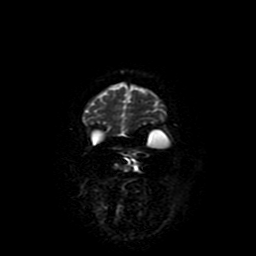
[im 24/72]
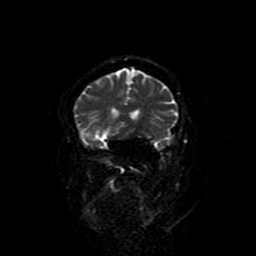
[im 36/72]
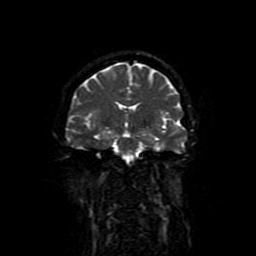
[im 48/72]
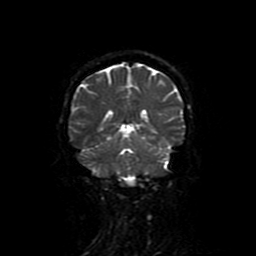
[im 60/72]
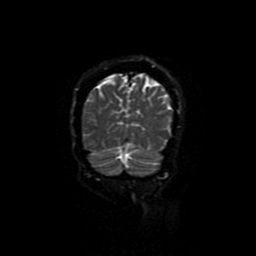
[im 72/72]
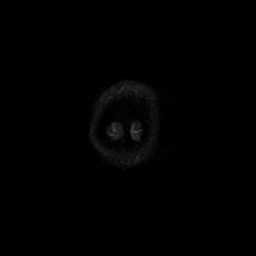

[Series 6: T2 · axial · 5.0mm · 0.43mm/px · z∈[-75,+60]mm · 2 of 22 slices shown]
[im 1/22]
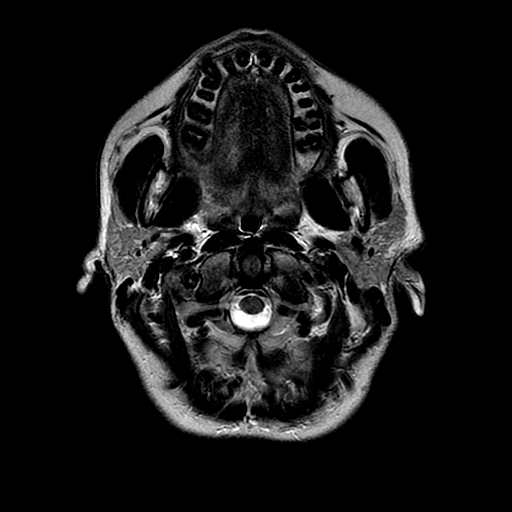
[im 22/22]
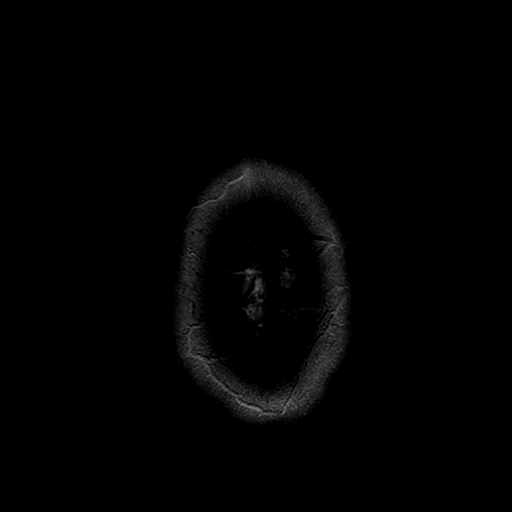

[Series 7: FLAIR · axial · 5.0mm · 0.43mm/px · z∈[-75,+60]mm · 2 of 22 slices shown]
[im 1/22]
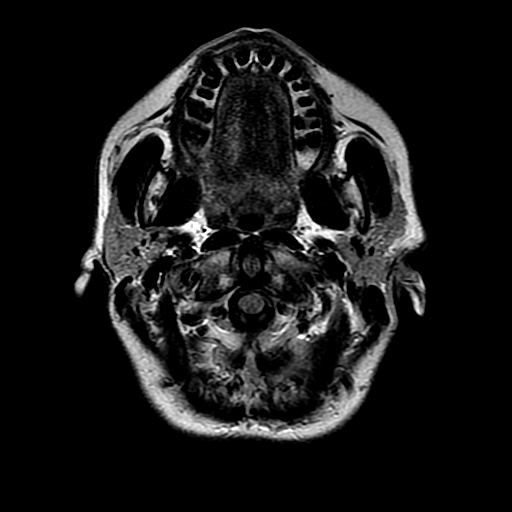
[im 22/22]
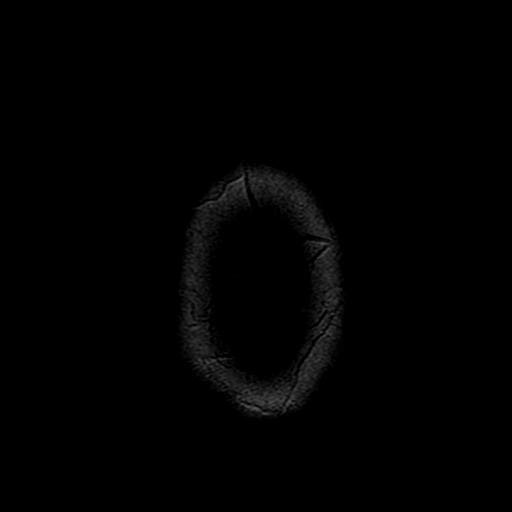

[Series 10: T2 post-contrast · coronal · 5.0mm · 0.45mm/px · 3 of 29 slices shown]
[im 1/29]
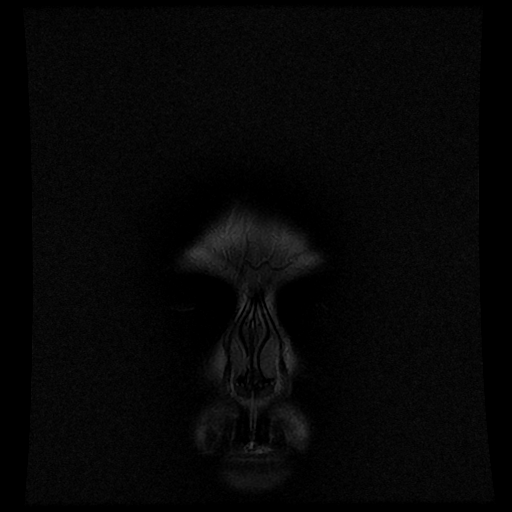
[im 15/29]
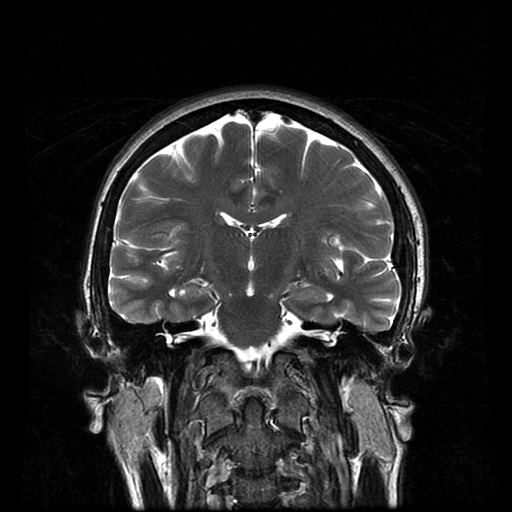
[im 29/29]
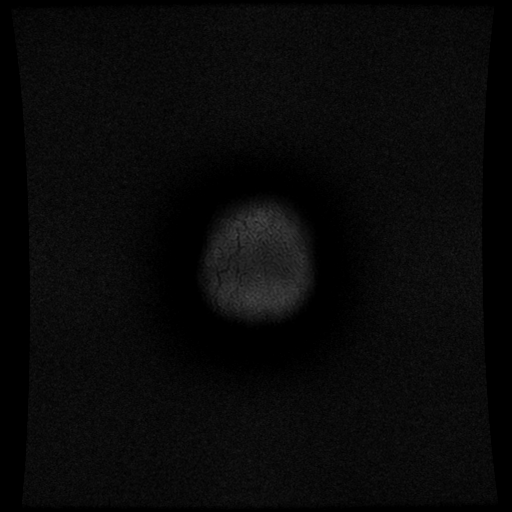

[Series 12: T1 post-contrast · coronal · 5.0mm · 0.45mm/px · 3 of 29 slices shown (1 of 2)]
[im 1/29]
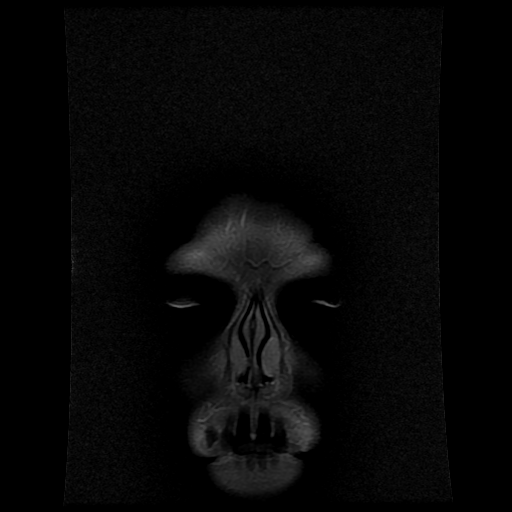
[im 15/29]
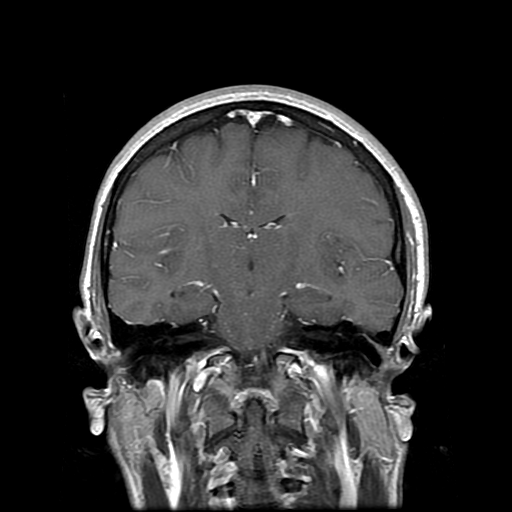
[im 29/29]
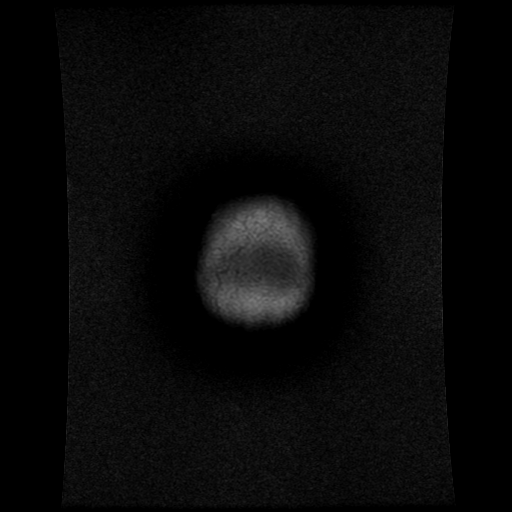

[Series 13: T1 post-contrast · sagittal · 5.0mm · 0.47mm/px · 2 of 24 slices shown (2 of 2)]
[im 1/24]
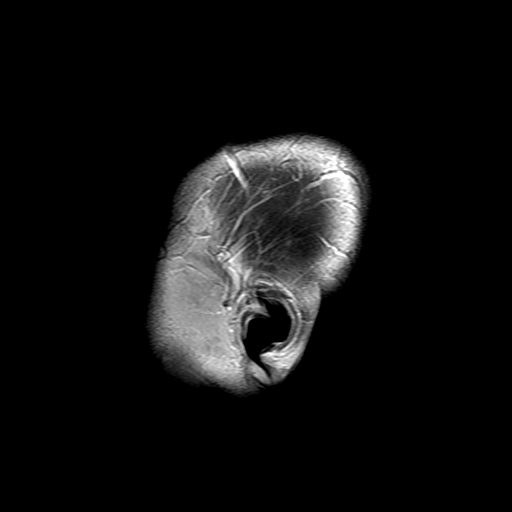
[im 24/24]
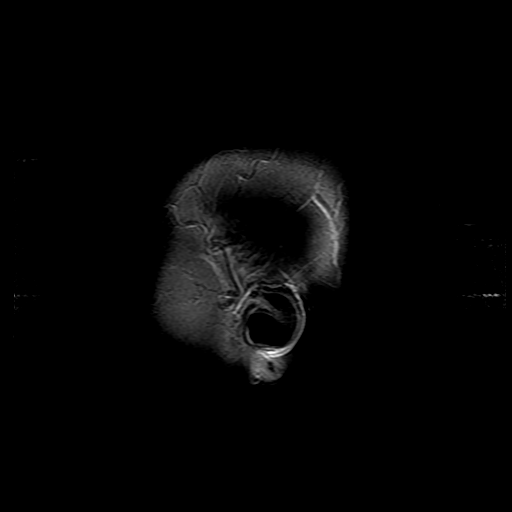

[Series 400: DWI · axial · 5.0mm · 1.09mm/px · z∈[-77,+60]mm · 3 of 29 slices shown (3 of 4)]
[im 1/29]
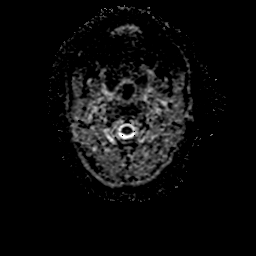
[im 15/29]
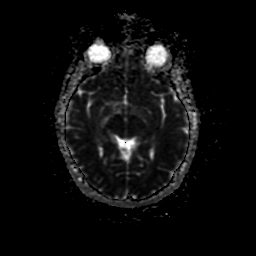
[im 29/29]
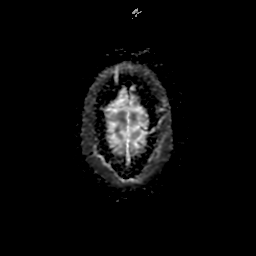

[Series 500: DWI · coronal · 5.0mm · 1.09mm/px · 4 of 36 slices shown (4 of 4)]
[im 1/36]
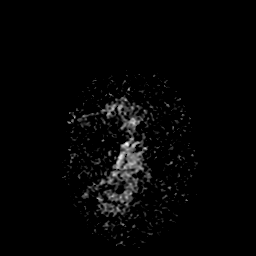
[im 12/36]
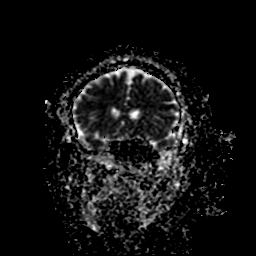
[im 24/36]
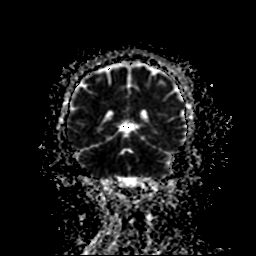
[im 36/36]
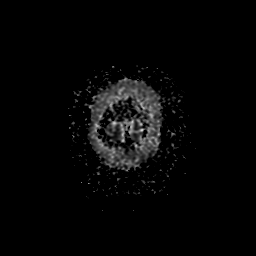

[33 of 48 positions shown; findings below may reference images not displayed]

FINDINGS: The CSF containing spaces are within normal limits for patient age.
No focal parenchymal signal abnormality is identified. No mass
lesion, midline shift, or extra-axial fluid collection. Ventricles
are normal in size without evidence of hydrocephalus.

No diffusion-weighted signal abnormality is identified to suggest
acute intracranial infarct. Gray-white matter differentiation is
maintained. Normal flow voids are seen within the intracranial
vasculature. No intracranial hemorrhage identified.

No abnormal enhancement on post-contrast sequences.

The cervicomedullary junction is normal. Pituitary gland is within
normal limits. Pituitary stalk is midline. The globes and optic
nerves demonstrate a normal appearance with normal signal intensity.

The bone marrow signal intensity is normal. Calvarium is intact.
Visualized upper cervical spine is within normal limits.

Scalp soft tissues are unremarkable.

Paranasal sinuses are clear.  No mastoid effusion.
IMPRESSION: Normal brain MRI.  No evidence of intracranial metastasis.

## 2017-02-14 ENCOUNTER — Telehealth: Payer: Self-pay | Admitting: Oncology

## 2017-02-14 NOTE — Telephone Encounter (Signed)
Confirmed 5/23 appt at 0830 per sch msg

## 2017-02-19 ENCOUNTER — Other Ambulatory Visit: Payer: Self-pay | Admitting: *Deleted

## 2017-02-19 DIAGNOSIS — C50411 Malignant neoplasm of upper-outer quadrant of right female breast: Secondary | ICD-10-CM

## 2017-02-20 ENCOUNTER — Ambulatory Visit (HOSPITAL_BASED_OUTPATIENT_CLINIC_OR_DEPARTMENT_OTHER): Payer: 59 | Admitting: Oncology

## 2017-02-20 ENCOUNTER — Other Ambulatory Visit (HOSPITAL_BASED_OUTPATIENT_CLINIC_OR_DEPARTMENT_OTHER): Payer: 59

## 2017-02-20 VITALS — BP 133/74 | HR 77 | Temp 98.2°F | Resp 18 | Ht 65.0 in | Wt 155.0 lb

## 2017-02-20 DIAGNOSIS — Z170 Estrogen receptor positive status [ER+]: Secondary | ICD-10-CM | POA: Diagnosis not present

## 2017-02-20 DIAGNOSIS — C50411 Malignant neoplasm of upper-outer quadrant of right female breast: Secondary | ICD-10-CM | POA: Diagnosis not present

## 2017-02-20 DIAGNOSIS — F329 Major depressive disorder, single episode, unspecified: Secondary | ICD-10-CM

## 2017-02-20 LAB — COMPREHENSIVE METABOLIC PANEL
ALT: 17 U/L (ref 0–55)
AST: 16 U/L (ref 5–34)
Albumin: 4 g/dL (ref 3.5–5.0)
Alkaline Phosphatase: 66 U/L (ref 40–150)
Anion Gap: 9 mEq/L (ref 3–11)
BUN: 12.4 mg/dL (ref 7.0–26.0)
CO2: 24 meq/L (ref 22–29)
CREATININE: 0.8 mg/dL (ref 0.6–1.1)
Calcium: 9.1 mg/dL (ref 8.4–10.4)
Chloride: 108 mEq/L (ref 98–109)
EGFR: 86 mL/min/{1.73_m2} — ABNORMAL LOW (ref >90)
GLUCOSE: 99 mg/dL (ref 70–140)
Potassium: 4.6 mEq/L (ref 3.5–5.1)
SODIUM: 141 meq/L (ref 136–145)
Total Bilirubin: 0.58 mg/dL (ref 0.20–1.20)
Total Protein: 7.5 g/dL (ref 6.4–8.3)

## 2017-02-20 LAB — CBC WITH DIFFERENTIAL/PLATELET
BASO%: 0.5 % (ref 0.0–2.0)
Basophils Absolute: 0 10*3/uL (ref 0.0–0.1)
EOS%: 6.4 % (ref 0.0–7.0)
Eosinophils Absolute: 0.2 10*3/uL (ref 0.0–0.5)
HEMATOCRIT: 43.4 % (ref 34.8–46.6)
HGB: 14.7 g/dL (ref 11.6–15.9)
LYMPH#: 0.9 10*3/uL (ref 0.9–3.3)
LYMPH%: 24.9 % (ref 14.0–49.7)
MCH: 31 pg (ref 25.1–34.0)
MCHC: 33.9 g/dL (ref 31.5–36.0)
MCV: 91.6 fL (ref 79.5–101.0)
MONO#: 0.4 10*3/uL (ref 0.1–0.9)
MONO%: 11.7 % (ref 0.0–14.0)
NEUT#: 2.1 10*3/uL (ref 1.5–6.5)
NEUT%: 56.5 % (ref 38.4–76.8)
Platelets: 195 10*3/uL (ref 145–400)
RBC: 4.74 10*6/uL (ref 3.70–5.45)
RDW: 12.2 % (ref 11.2–14.5)
WBC: 3.8 10*3/uL — ABNORMAL LOW (ref 3.9–10.3)

## 2017-02-20 MED ORDER — BUPROPION HCL ER (XL) 150 MG PO TB24: 150.0000 mg | ORAL_TABLET | Freq: Every day | ORAL | 2 refills | Status: DC

## 2017-02-20 NOTE — Progress Notes (Signed)
[facility]  [phone] Fax:([phone]) [phone]     ID: Taylor Perez DOB: 17-Jun-1971  MR#: [id number]  GMW#:[id number]  Patient Care Team: Kathyrn Lass, MD as PCP - General (Family Medicine) Excell Seltzer, MD as Consulting Physician (General Surgery) Kei Mcelhiney, Virgie Dad, MD as Consulting Physician (Oncology) Thea Silversmith, MD (Inactive) as Consulting Physician (Radiation Oncology)  CHIEF COMPLAINT: Estrogen receptor positive breast cancer  CURRENT TREATMENT: tamoxifen   BREAST CANCER HISTORY: From the original intake note:  "Taylor Perez" noted a change in her right breast sometime around February or March 2015. She waited to see whether it would resolve, but as it did not she eventually brought it to the attention of Dr. Sabra Heck, who confirmed the finding and set up the patient for bilateral diagnostic mammography and right breast ultrasonography at the [facility] 05/28/2014. Breast density was category D. There was an irregular mass with spiculations in the right breast upper-outer quadrant which was palpable and described as hard. Ultrasound confirmed an irregular spiculated hypoechoic mass occupying the entire upper outer quadrant of the right breast and measuring at least 4.5 cm. In the right axilla there was a lymph node which was normal in size but showed cortical nodularity and abnormal peripheral hypervascularity.  Accordingly on 06/04/2014 the patient underwent biopsy of the breast mass (the lymph node described from the prior ultrasound was found to have a large fatty hilum and symmetric cortex but a moderate size artery and vein draped over it and therefore it was not biopsied). The pathology from this procedure showed ([id number]) and invasive ductal carcinoma, E-cadherin positive, estrogen receptor 91% positive, progesterone receptor 58% positive, both with moderate staining intensity, with an MIB-1 of 9%, and no HER-2 amplification, the signals ratio  being 1.17 and the number per cell 1.70.  On 06/11/2014 the patient underwent bilateral breast MRI. This showed an area of architectural distortion an irregular clumped enhancement extending for a total of 7.4 cm in the right breast. He comes close to the skin of the anterior lateral right breast but no definite abnormal skin enhancement was noted. There were no findings suggestive of nipple involvement and no findings of the pectoralis involvement. The left breast and axillary lymph nodes are unremarkable. However there was a nonenhancing T2 increased signal along the left aspect of the lower thoracic vertebral body which was felt to require further evaluation.  The patient's subsequent history is as detailed below  INTERVAL HISTORY: Taylor Perez returns today after an absence of 2 years for follow-up of her estrogen receptor positive breast cancer. At her last visit here we prescribed tamoxifen, but she never started that medication. She says she consulted with 2 friends who told her that they would not take it if it were then.  Shortly after that she moved to [place] because of some problems with work here. However she tells me she finds the work she now has an [place] is even worse and the community is not where she wants to live. She is interested in resuming follow-up here, despite the long drive this would involve.  She has no primary care physician and has not established herself with a pharmacy locally.  REVIEW OF SYSTEMS: Taylor Perez is terrified because she has developed pain in the right chest wall radiating to the back. This is new. It is not positional. It is intermittent but is becoming more frequent. It is not associated with redness, swelling, or fever. She admits to being severely depressed. She has occasional mild hot flashes  and some vaginal dryness. Recall she is status post hysterectomy but not bilateral salpingo-oophorectomy. She does not sense any obvious cycling. A detailed review  of systems today was otherwise noncontributory  PAST MEDICAL HISTORY: Past Medical History:  Diagnosis Date  . Anxiety   . Arthritis    right arm and shoulder  . Asthma   . Breast cancer   . Cancer    breast  . Hashimoto's thyroiditis   . Headache    sinus  . Other isolated or specific phobias   . Peripheral vascular disease    varicose veins  . Pneumonia 2006  . Stroke 2008   mini stroke  . Vitamin D deficiency     PAST SURGICAL HISTORY: Past Surgical History:  Procedure Laterality Date  . ABDOMINAL HYSTERECTOMY  1997   uterine prolapse  . BILATERAL TOTAL MASTECTOMY WITH AXILLARY LYMPH NODE DISSECTION Bilateral 07/16/2014   Procedure: BILATERAL TOTAL  MASTECTOMY WITH RIGHT  AXILLARY SENTINEL NODE BIOPSY, PROPHYLATIC LEFT BREAST Mastectomy;  Surgeon: Excell Seltzer, MD;  Location: [facility];  Service: General;  Laterality: Bilateral;  . TOTAL MASTECTOMY Bilateral 07/16/2014   WITH SENTINAL NODE BIOPSY  . TUBAL LIGATION  1994  . VEIN LIGATION AND STRIPPING      FAMILY HISTORY Family History  Problem Relation Age of Onset  . Breast cancer Paternal Aunt 27  . Ovarian cancer Paternal Grandmother 15  . Lung cancer Paternal Grandfather 44  . Breast cancer Paternal Aunt 68       reportedly BRCA negative.   . Depression Mother   . Pulmonary fibrosis Father   . Diabetes Father    The patient's father died from complications of pulmonary fibrosis at the age of 58. The patient's mother is living, age 35. The patient had one brother, no sisters. The patient's paternal grandmother had ovarian cancer. The patient's father had 3 sisters. 2 of them have been diagnosed with breast cancer, one in her 37s one in her 14s. The one in her 32s was tested for the BRCA genes and was negative.  GYNECOLOGIC HISTORY:  No LMP recorded. Patient has had a hysterectomy. Menarche age 66, first live birth age 85. The patient is GX P2. She had a hysterectomy in 1997. She is not on hormone  replacement.  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 She left that job for a similar job in [place] in 2016. She is divorced. The patient's son Darely Becknell is currently 97 and graduated from [employer] in math.. He is currently living with the patient. The patient's other son died at the age of 25. The patient has no grandchildren. She is not a Ambulance person.    ADVANCED DIRECTIVES: Not in place. At her 06/16/2014 visit the patient was given the appropriate documents to complete. She is considering naming her brother as her healthcare power of attorney.   HEALTH MAINTENANCE: Social History  Substance Use Topics  . Smoking status: Never Smoker  . Smokeless tobacco: Never Used  . Alcohol use Yes     Comment: not lately     Colonoscopy:  PAP:  Bone density:  Lipid panel:  Allergies  Allergen Reactions  . Demerol [Meperidine]     seizures  . Other Itching    Melon    Current Outpatient Prescriptions  Medication Sig Dispense Refill  . albuterol (PROVENTIL HFA;VENTOLIN HFA) 108 (90 BASE) MCG/ACT inhaler Inhale 1 puff into the lungs every 6 (  six) hours as needed for wheezing or shortness of breath.    Marland Kitchen CALCIUM-MAGNESIUM-ZINC PO Take 1 tablet by mouth daily.    . Cats Claw, Uncaria tomentosa, (CATS CLAW PO) Take 1 drop by mouth daily.    . Cholecalciferol (VITAMIN D3 PO) Take 5,600 Int'l Units by mouth daily.    . Evening Primrose Oil 1000 MG CAPS Take by mouth.    . Flaxseed, Linseed, (FLAX SEED OIL PO) Take by mouth.    Nyoka Cowden Tea, Camillia sinensis, (GREEN TEA EXTRACT PO) Take 1 capsule by mouth daily.    . Iodine, Kelp, (KELP PO) Take 225 mcg by mouth daily.    . Menaquinone-7 (VITAMIN K2 PO) Take 1 capsule by mouth daily.    . Multiple Vitamin (MULTI-VITAMIN PO) Take 1 tablet by mouth daily.    . OIL OF OREGANO PO Take 1 capsule by mouth daily.    . SELENIUM PO Take 200 mcg by mouth daily.      . TURMERIC PO Take 2 capsules by mouth daily.    Marland Kitchen VITAMIN E PO Take 1 tablet by mouth daily.     No current facility-administered medications for this visit.     OBJECTIVE: Middle-aged white woman who Was tearful during today's visit Vitals:   02/20/17 0838  BP: 133/74  Pulse: 77  Resp: 18  Temp: 98.2 F (36.8 C)     Body mass index is 25.79 kg/m.    ECOG FS:1 - Symptomatic but completely ambulatory  Sclerae unicteric, pupils round and equal Oropharynx clear and moist No cervical or supraclavicular adenopathy Lungs no rales or rhonchi Heart regular rate and rhythm Abd soft, nontender, positive bowel sounds MSK no focal spinal tenderness, no upper extremity lymphedema; the area of pain in the right chest wall and back areas show no palpable abnormality and no focal tenderness Neuro: nonfocal, well oriented, appropriate affect Breasts: Status post bilateral mastectomies. There is no evidence of chest wall recurrence. Both axillae are benign.   LAB RESULTS:  CMP     Component Value Date/Time   NA 139 11/16/2014 1556   K 3.8 11/16/2014 1556   CL 104 07/17/2014 0524   CO2 20 (L) 11/16/2014 1556   GLUCOSE 94 11/16/2014 1556   BUN 14.4 11/16/2014 1556   CREATININE 0.8 11/16/2014 1556   CALCIUM 9.2 11/16/2014 1556   PROT 7.1 11/16/2014 1556   ALBUMIN 3.9 11/16/2014 1556   AST 20 11/16/2014 1556   ALT 17 11/16/2014 1556   ALKPHOS 58 11/16/2014 1556   BILITOT 0.41 11/16/2014 1556   GFRNONAA >90 07/17/2014 0524   GFRAA >90 07/17/2014 0524    I No results found for: SPEP  Lab Results  Component Value Date   WBC 3.8 (L) 02/20/2017   NEUTROABS 2.1 02/20/2017   HGB 14.7 02/20/2017   HCT 43.4 02/20/2017   MCV 91.6 02/20/2017   PLT 195 02/20/2017      Chemistry      Component Value Date/Time   NA 139 11/16/2014 1556   K 3.8 11/16/2014 1556   CL 104 07/17/2014 0524   CO2 20 (L) 11/16/2014 1556   BUN 14.4 11/16/2014 1556   CREATININE 0.8 11/16/2014 1556       Component Value Date/Time   CALCIUM 9.2 11/16/2014 1556   ALKPHOS 58 11/16/2014 1556   AST 20 11/16/2014 1556   ALT 17 11/16/2014 1556   BILITOT 0.41 11/16/2014 1556       No results found for: LABCA2  No components found for: LABCA125  No results for input(s): INR in the last 168 hours.  Urinalysis No results found for: COLORURINE   STUDIES: No results found.  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 Margins were ample. Simple left mastectomy showed no evidence of malignancy  (2) Oncotype score of 16 predicts a risk of recurrence outside the breast over the next 10 years of 10% if the patient's only systemic therapy is tamoxifen for 5 years. It also predicts no benefit from chemotherapy.  (3) postmastectomy radiation completed 11/05/2014: Right breast / 50.4 Gray @ 1.8 Pearline Cables per fraction x 28 fractions  (4) tamoxifen prescribed 12/31/2014, never started by patient  (5) the patient is not planning on reconstruction  (6) OvaNext gene panel through [facility] reported October 2015, with no deleterious mutations.  PLAN: Taylor Perez dropped out of medical attention for the last 2 years. She now wishes to reestablish follow-up here despite the distance from [place]. We are glad to try to accommodate her.  I reassured her that pain at the surgical site is not uncommon and does not necessarily indicate recurrence, nevertheless it does need to be evaluated and I have put her in for a CT of the chest with contrast. Hopefully this can be done within the  next week or so. I asked her to let us know after its done so we can call her with results within 24 hours.  She has had Wellbutrin in the past. She is markedly depressed at present. We discussed various options. I have written her for Wellbutrin to take 150 mg daily for 15 days. After that she has the option of continuing at that dose or doubling the dose to 300 mg. She will call if she has any side effects. She is very aware of the possible complications and toxicities of this medication, which as stated she has taken previously.  I gave her written information on tamoxifen. It is not clear to me that she is postmenopausal at this point so if she wants anti-estrogens it should be tamoxifen. At the next visit I will check an [facility] and estradiol level.  Tentatively I have put her down for 3 months from now. If she does start tamoxifen she will have been on it long enough to decide on tolerance. Otherwise we will simply continue routine follow-up at that time. Chauncey Cruel, MD   02/20/2017 8:44 AM

## 2017-02-27 ENCOUNTER — Encounter (HOSPITAL_COMMUNITY): Payer: Self-pay

## 2017-02-27 ENCOUNTER — Ambulatory Visit (HOSPITAL_COMMUNITY)
Admission: RE | Admit: 2017-02-27 | Discharge: 2017-02-27 | Disposition: A | Discharge status: Home / Self Care | Payer: 59 | Source: Ambulatory Visit | Attending: Oncology | Admitting: Oncology

## 2017-02-27 DIAGNOSIS — C50411 Malignant neoplasm of upper-outer quadrant of right female breast: Secondary | ICD-10-CM | POA: Diagnosis not present

## 2017-02-27 DIAGNOSIS — Z170 Estrogen receptor positive status [ER+]: Secondary | ICD-10-CM | POA: Insufficient documentation

## 2017-02-27 DIAGNOSIS — Z9013 Acquired absence of bilateral breasts and nipples: Secondary | ICD-10-CM | POA: Insufficient documentation

## 2017-02-27 DIAGNOSIS — Z923 Personal history of irradiation: Secondary | ICD-10-CM | POA: Insufficient documentation

## 2017-02-27 MED ORDER — IOPAMIDOL (ISOVUE-300) INJECTION 61%
INTRAVENOUS | Status: AC
  Filled 2017-02-27: qty 75

## 2017-02-27 MED ORDER — IOPAMIDOL (ISOVUE-300) INJECTION 61%
75.0000 mL | Freq: Once | INTRAVENOUS | Status: AC | PRN
  Administered 2017-02-27: 75 mL via INTRAVENOUS

## 2017-03-04 ENCOUNTER — Other Ambulatory Visit: Payer: Self-pay | Admitting: Oncology

## 2017-03-04 NOTE — Progress Notes (Unsigned)
[place]  Telephone:(336) [phone] Fax:(336) [phone]     ID: Taylor Perez DOB: 12-Apr-1971  MR#: [id number]  ZCH#:[id number]  Patient Care Team: Kathyrn Lass, MD as PCP - General (Family Medicine) Excell Seltzer, MD as Consulting Physician (General Surgery) Skyrah Krupp, Virgie Dad, MD as Consulting Physician (Oncology) Thea Silversmith, MD (Inactive) as Consulting Physician (Radiation Oncology)  CHIEF COMPLAINT: Estrogen receptor positive breast cancer  CURRENT TREATMENT: tamoxifen   BREAST CANCER HISTORY: From the original intake note:  "Taylor Perez" noted a change in her right breast sometime around February or March 2015. She waited to see whether it would resolve, but as it did not she eventually brought it to the attention of Dr. Sabra Heck, who confirmed the finding and set up the patient for bilateral diagnostic mammography and right breast ultrasonography at the breast Center 05/28/2014. Breast density was category D. There was an irregular mass with spiculations in the right breast upper-outer quadrant which was palpable and described as hard. Ultrasound confirmed an irregular spiculated hypoechoic mass occupying the entire upper outer quadrant of the right breast and measuring at least 4.5 cm. In the right axilla there was a lymph node which was normal in size but showed cortical nodularity and abnormal peripheral hypervascularity.  Accordingly on 06/04/2014 the patient underwent biopsy of the breast mass (the lymph node described from the prior ultrasound was found to have a large fatty hilum and symmetric cortex but a moderate size artery and vein draped over it and therefore it was not biopsied). The pathology from this procedure showed ([id number] [id number]) and invasive ductal carcinoma, E-cadherin positive, estrogen receptor 91% positive, progesterone receptor 58% positive, both with moderate staining intensity, with an MIB-1 of 9%, and no HER-2 amplification, the signals ratio  being 1.17 and the number per cell 1.70.  On 06/11/2014 the patient underwent bilateral breast MRI. This showed an area of architectural distortion an irregular clumped enhancement extending for a total of 7.4 cm in the right breast. He comes close to the skin of the anterior lateral right breast but no definite abnormal skin enhancement was noted. There were no findings suggestive of nipple involvement and no findings of the pectoralis involvement. The left breast and axillary lymph nodes are unremarkable. However there was a nonenhancing T2 increased signal along the left aspect of the lower thoracic vertebral body which was felt to require further evaluation.  The patient's subsequent history is as detailed below  INTERVAL HISTORY: Taylor Perez returns today after an absence of 2 years for follow-up of her estrogen receptor positive breast cancer. At her last visit here we prescribed tamoxifen, but she never started that medication. She says she consulted with 2 friends who told her that they would not take it if it were then.  Shortly after that she moved to [place] because of some problems with work here. However she tells me she finds the work she now has an [place] is even worse and the community is not where she wants to live. She is interested in resuming follow-up here, despite the long drive this would involve.  She has no primary care physician and has not established herself with a pharmacy locally.  REVIEW OF SYSTEMS: Taylor Perez is terrified because she has developed pain in the right chest wall radiating to the back. This is new. It is not positional. It is intermittent but is becoming more frequent. It is not associated with redness, swelling, or fever. She admits to being severely depressed. She has occasional mild hot flashes  and some vaginal dryness. Recall she is status post hysterectomy but not bilateral salpingo-oophorectomy. She does not sense any obvious cycling. A detailed review  of systems today was otherwise noncontributory  PAST MEDICAL HISTORY: Past Medical History:  Diagnosis Date  . Anxiety   . Arthritis    right arm and shoulder  . Asthma   . Breast cancer ([place])   . Cancer (HCC)    breast  . Hashimoto's thyroiditis   . Headache    sinus  . Other isolated or specific phobias   . Peripheral vascular disease (HCC)    varicose veins  . Pneumonia 2006  . Stroke [facility]) 2008   mini stroke  . Vitamin D deficiency     PAST SURGICAL HISTORY: Past Surgical History:  Procedure Laterality Date  . ABDOMINAL HYSTERECTOMY  1997   uterine prolapse  . BILATERAL TOTAL MASTECTOMY WITH AXILLARY LYMPH NODE DISSECTION Bilateral 07/16/2014   Procedure: BILATERAL TOTAL  MASTECTOMY WITH RIGHT  AXILLARY SENTINEL NODE BIOPSY, PROPHYLATIC LEFT BREAST Mastectomy;  Surgeon: Excell Seltzer, MD;  Location: [facility];  Service: General;  Laterality: Bilateral;  . TOTAL MASTECTOMY Bilateral 07/16/2014   WITH SENTINAL NODE BIOPSY  . TUBAL LIGATION  1994  . VEIN LIGATION AND STRIPPING      FAMILY HISTORY Family History  Problem Relation Age of Onset  . Breast cancer Paternal Aunt 16  . Ovarian cancer Paternal Grandmother 91  . Lung cancer Paternal Grandfather 37  . Breast cancer Paternal Aunt 57       reportedly BRCA negative.   . Depression Mother   . Pulmonary fibrosis Father   . Diabetes Father    The patient's father died from complications of pulmonary fibrosis at the age of 48. The patient's mother is living, age 9. The patient had one brother, no sisters. The patient's paternal grandmother had ovarian cancer. The patient's father had 3 sisters. 2 of them have been diagnosed with breast cancer, one in her 86s one in her 67s. The one in her 68s was tested for the BRCA genes and was negative.  GYNECOLOGIC HISTORY:  No LMP recorded. Patient has had a hysterectomy. Menarche age 18, first live birth age 19. The patient is GX P2. She had a hysterectomy in 1997. She is  not on hormone replacement.  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 She left that job for a similar job in [place] in 2016. She is divorced. The patient's son Kerina Simoneau is currently 100 and graduated from [employer] in math.. He is currently living with the patient. The patient's other son died at the age of 12. The patient has no grandchildren. She is not a Ambulance person.    ADVANCED DIRECTIVES: Not in place. At her 06/16/2014 visit the patient was given the appropriate documents to complete. She is considering naming her brother as her healthcare power of attorney.   HEALTH MAINTENANCE: Social History  Substance Use Topics  . Smoking status: Never Smoker  . Smokeless tobacco: Never Used  . Alcohol use Yes     Comment: not lately     Colonoscopy:  PAP:  Bone density:  Lipid panel:  Allergies  Allergen Reactions  . Demerol [Meperidine]     seizures  . Other Itching    Melon    Current Outpatient Prescriptions  Medication Sig Dispense Refill  . buPROPion (WELLBUTRIN XL) 150 MG 24 hr tablet Take 1 tablet (150 mg  total) by mouth daily. 45 tablet 2   No current facility-administered medications for this visit.     OBJECTIVE: Middle-aged white woman who Was tearful during today's visit There were no vitals filed for this visit.   There is no height or weight on file to calculate BMI.    ECOG FS:1 - Symptomatic but completely ambulatory  Sclerae unicteric, pupils round and equal Oropharynx clear and moist No cervical or supraclavicular adenopathy Lungs no rales or rhonchi Heart regular rate and rhythm Abd soft, nontender, positive bowel sounds MSK no focal spinal tenderness, no upper extremity lymphedema; the area of pain in the right chest wall and back areas show no palpable abnormality and no focal tenderness Neuro: nonfocal, well oriented, appropriate affect Breasts: Status post bilateral  mastectomies. There is no evidence of chest wall recurrence. Both axillae are benign.   LAB RESULTS:  CMP     Component Value Date/Time   NA 141 02/20/2017 0827   K 4.6 02/20/2017 0827   CL 104 07/17/2014 0524   CO2 24 02/20/2017 0827   GLUCOSE 99 02/20/2017 0827   BUN 12.4 02/20/2017 0827   CREATININE 0.8 02/20/2017 0827   CALCIUM 9.1 02/20/2017 0827   PROT 7.5 02/20/2017 0827   ALBUMIN 4.0 02/20/2017 0827   AST 16 02/20/2017 0827   ALT 17 02/20/2017 0827   ALKPHOS 66 02/20/2017 0827   BILITOT 0.58 02/20/2017 0827   GFRNONAA >90 07/17/2014 0524   GFRAA >90 07/17/2014 0524    I No results found for: SPEP  Lab Results  Component Value Date   WBC 3.8 (L) 02/20/2017   NEUTROABS 2.1 02/20/2017   HGB 14.7 02/20/2017   HCT 43.4 02/20/2017   MCV 91.6 02/20/2017   PLT 195 02/20/2017      Chemistry      Component Value Date/Time   NA 141 02/20/2017 0827   K 4.6 02/20/2017 0827   CL 104 07/17/2014 0524   CO2 24 02/20/2017 0827   BUN 12.4 02/20/2017 0827   CREATININE 0.8 02/20/2017 0827      Component Value Date/Time   CALCIUM 9.1 02/20/2017 0827   ALKPHOS 66 02/20/2017 0827   AST 16 02/20/2017 0827   ALT 17 02/20/2017 0827   BILITOT 0.58 02/20/2017 0827       No results found for: LABCA2  No components found for: LABCA125  No results for input(s): INR in the last 168 hours.  Urinalysis No results found for: COLORURINE   STUDIES: Ct Chest W Contrast  Result Date: 02/28/2017 CLINICAL DATA:  Worsening right-sided chest wall pain. Previous right upper outer quadrant breast carcinoma and double mastectomy. EXAM: CT CHEST WITH CONTRAST TECHNIQUE: Multidetector CT imaging of the chest was performed during intravenous contrast administration. CONTRAST:  60m ISOVUE-300 IOPAMIDOL (ISOVUE-300) INJECTION 61% COMPARISON:  PET-CT 06/19/2014 FINDINGS: Cardiovascular:  No acute findings. Mediastinum/Nodes: No masses or pathologically enlarged lymph nodes identified.  Previous bilateral mastectomies. No evidence of axillary lymphadenopathy. Lungs/Pleura: No pulmonary infiltrate or mass identified. No effusion present. Post radiation changes seen in anterior right upper lobe. Upper Abdomen: Stable 1.2 cm left adrenal nodule, consistent with benign adenoma. Otherwise unremarkable. Musculoskeletal: No suspicious bone lesions. No evidence of chest wall mass. IMPRESSION: Right upper lobe post radiation changes. No acute findings. No evidence of recurrent or metastatic carcinoma. Electronically Signed   By: JEarle GellM.D.   On: 02/28/2017 09:38    ASSESSMENT: 46y.o. BRCA negative [place] woman status post right breast upper-outer quadrant biopsy  06/04/2014 for a clinical T3 N0-1, stage II/III invasive ductal carcinoma, grade 2, estrogen receptor 91% positive, progesterone receptor 58% positive, with an MIB-1 of 9%, and no HER-2 amplification  (1) status post right mastectomy with sentinel lymph node sampling 07/16/2014 for an mpT3 pN0, stage IIA invasive ductal carcinoma, grade 2, with repeat HER-2 again negative. Margins were ample. Simple left mastectomy showed no evidence of malignancy  (2) Oncotype score of 16 predicts a risk of recurrence outside the breast over the next 10 years of 10% if the patient's only systemic therapy is tamoxifen for 5 years. It also predicts no benefit from chemotherapy.  (3) postmastectomy radiation completed 11/05/2014: Right breast / 50.4 Gray @ 1.8 Pearline Cables per fraction x 28 fractions  (4) tamoxifen prescribed 12/31/2014, never started by patient  (5) the patient is not planning on reconstruction  (6) OvaNext gene panel through [facility] reported October 2015, with no deleterious mutations.  PLAN: Taylor Perez dropped out of medical attention for the last 2 years. She now wishes to reestablish follow-up here despite the distance from [place]. We are glad to try to accommodate her.  I reassured her that pain at the surgical  site is not uncommon and does not necessarily indicate recurrence, nevertheless it does need to be evaluated and I have put her in for a CT of the chest with contrast. Hopefully this can be done within the next week or so. I asked her to let us know after its done so we can call her with results within 24 hours.  She has had Wellbutrin in the past. She is markedly depressed at present. We discussed various options. I have written her for Wellbutrin to take 150 mg daily for 15 days. After that she has the option of continuing at that dose or doubling the dose to 300 mg. She will call if she has any side effects. She is very aware of the possible complications and toxicities of this medication, which as stated she has taken previously.  I gave her written information on tamoxifen. It is not clear to me that she is postmenopausal at this point so if she wants anti-estrogens it should be tamoxifen. At the next visit I will check an [facility] and estradiol level.  Tentatively I have put her down for 3 months from now. If she does start tamoxifen she will have been on it long enough to decide on tolerance. Otherwise we will simply continue routine follow-up at that time. Chauncey Cruel, MD   03/04/2017 10:29 AM

## 2017-05-20 ENCOUNTER — Ambulatory Visit (HOSPITAL_BASED_OUTPATIENT_CLINIC_OR_DEPARTMENT_OTHER): Payer: BLUE CROSS/BLUE SHIELD | Admitting: Oncology

## 2017-05-20 ENCOUNTER — Other Ambulatory Visit (HOSPITAL_BASED_OUTPATIENT_CLINIC_OR_DEPARTMENT_OTHER): Payer: BLUE CROSS/BLUE SHIELD

## 2017-05-20 VITALS — BP 110/68 | HR 65 | Temp 98.2°F | Resp 18 | Ht 65.0 in | Wt 159.3 lb

## 2017-05-20 DIAGNOSIS — Z170 Estrogen receptor positive status [ER+]: Principal | ICD-10-CM

## 2017-05-20 DIAGNOSIS — C50411 Malignant neoplasm of upper-outer quadrant of right female breast: Secondary | ICD-10-CM

## 2017-05-20 DIAGNOSIS — R51 Headache: Secondary | ICD-10-CM

## 2017-05-20 LAB — CBC WITH DIFFERENTIAL/PLATELET
BASO%: 0.6 % (ref 0.0–2.0)
Basophils Absolute: 0 10*3/uL (ref 0.0–0.1)
EOS%: 8.7 % — AB (ref 0.0–7.0)
Eosinophils Absolute: 0.3 10*3/uL (ref 0.0–0.5)
HCT: 41.8 % (ref 34.8–46.6)
HGB: 14.4 g/dL (ref 11.6–15.9)
LYMPH#: 1 10*3/uL (ref 0.9–3.3)
LYMPH%: 26.9 % (ref 14.0–49.7)
MCH: 31.2 pg (ref 25.1–34.0)
MCHC: 34.4 g/dL (ref 31.5–36.0)
MCV: 90.5 fL (ref 79.5–101.0)
MONO#: 0.4 10*3/uL (ref 0.1–0.9)
MONO%: 10.4 % (ref 0.0–14.0)
NEUT#: 1.9 10*3/uL (ref 1.5–6.5)
NEUT%: 53.4 % (ref 38.4–76.8)
Platelets: 193 10*3/uL (ref 145–400)
RBC: 4.62 10*6/uL (ref 3.70–5.45)
RDW: 12.1 % (ref 11.2–14.5)
WBC: 3.6 10*3/uL — ABNORMAL LOW (ref 3.9–10.3)

## 2017-05-20 LAB — COMPREHENSIVE METABOLIC PANEL
ALT: 15 U/L (ref 0–55)
AST: 16 U/L (ref 5–34)
Albumin: 3.7 g/dL (ref 3.5–5.0)
Alkaline Phosphatase: 64 U/L (ref 40–150)
Anion Gap: 6 mEq/L (ref 3–11)
BUN: 9.5 mg/dL (ref 7.0–26.0)
CHLORIDE: 106 meq/L (ref 98–109)
CO2: 26 meq/L (ref 22–29)
CREATININE: 0.9 mg/dL (ref 0.6–1.1)
Calcium: 9.1 mg/dL (ref 8.4–10.4)
EGFR: 80 mL/min/{1.73_m2} — ABNORMAL LOW (ref >90)
Glucose: 86 mg/dl (ref 70–140)
POTASSIUM: 4.4 meq/L (ref 3.5–5.1)
SODIUM: 139 meq/L (ref 136–145)
Total Bilirubin: 0.44 mg/dL (ref 0.20–1.20)
Total Protein: 7.3 g/dL (ref 6.4–8.3)

## 2017-05-20 NOTE — Progress Notes (Signed)
[place]  Telephone:(336) [phone] Fax:(336) [phone]     ID: Taylor Perez DOB: March 29, 1971  MR#: [id number]  EPP#:[id number]  Patient Care Team: Kathyrn Lass, MD as PCP - General (Family Medicine) Excell Seltzer, MD as Consulting Physician (General Surgery) Tag Wurtz, Virgie Dad, MD as Consulting Physician (Oncology) Thea Silversmith, MD (Inactive) as Consulting Physician (Radiation Oncology)  CHIEF COMPLAINT: Estrogen receptor positive breast cancer  CURRENT TREATMENT: Observation   BREAST CANCER HISTORY: From the original intake note:  "Taylor Perez" noted a change in her right breast sometime around February or March 2015. She waited to see whether it would resolve, but as it did not she eventually brought it to the attention of Dr. Sabra Heck, who confirmed the finding and set up the patient for bilateral diagnostic mammography and right breast ultrasonography at the breast Center 05/28/2014. Breast density was category D. There was an irregular mass with spiculations in the right breast upper-outer quadrant which was palpable and described as hard. Ultrasound confirmed an irregular spiculated hypoechoic mass occupying the entire upper outer quadrant of the right breast and measuring at least 4.5 cm. In the right axilla there was a lymph node which was normal in size but showed cortical nodularity and abnormal peripheral hypervascularity.  Accordingly on 06/04/2014 the patient underwent biopsy of the breast mass (the lymph node described from the prior ultrasound was found to have a large fatty hilum and symmetric cortex but a moderate size artery and vein draped over it and therefore it was not biopsied). The pathology from this procedure showed ([id number] [id number]) and invasive ductal carcinoma, E-cadherin positive, estrogen receptor 91% positive, progesterone receptor 58% positive, both with moderate staining intensity, with an MIB-1 of 9%, and no HER-2 amplification, the signals ratio  being 1.17 and the number per cell 1.70.  On 06/11/2014 the patient underwent bilateral breast MRI. This showed an area of architectural distortion an irregular clumped enhancement extending for a total of 7.4 cm in the right breast. He comes close to the skin of the anterior lateral right breast but no definite abnormal skin enhancement was noted. There were no findings suggestive of nipple involvement and no findings of the pectoralis involvement. The left breast and axillary lymph nodes are unremarkable. However there was a nonenhancing T2 increased signal along the left aspect of the lower thoracic vertebral body which was felt to require further evaluation.  The patient's subsequent history is as detailed below  INTERVAL HISTORY: Taylor Perez returns today for follow-up of her estrogen receptor positive breast cancer. At the last visit we again discussed tamoxifen and I again pointed that prescription in for her and she again I decided not to take it.  We did start her on Wellbutrin 150 mg last visit. After a month she did not feel it was helping. She decided she would rather stopped the Wellbutrin and drink a little wine here and there then go up to 300 mg daily. Accordingly she is now essentially on no medications.  REVIEW OF SYSTEMS: Taylor Perez continues to work full-time and finds are well stressful. She has stress headaches in the back of her head at times. She also has some sinus headaches, around the bridge of the nose and in and deep behind the eyes. These are intermittent. There easily controlled on ibuprofen. There has been no nausea or vomiting, no visual changes. Sometimes she feels a little faint she says. There has been no falls no vertigo and no weakness. She exercises regularly with a stationary bike daily. A  detailed review of systems today was otherwise stable  PAST MEDICAL HISTORY: Past Medical History:  Diagnosis Date  . Anxiety   . Arthritis    right arm and shoulder  . Asthma   .  Breast cancer ([place])   . Cancer (HCC)    breast  . Hashimoto's thyroiditis   . Headache    sinus  . Other isolated or specific phobias   . Peripheral vascular disease (HCC)    varicose veins  . Pneumonia 2006  . Stroke [facility]) 2008   mini stroke  . Vitamin D deficiency     PAST SURGICAL HISTORY: Past Surgical History:  Procedure Laterality Date  . ABDOMINAL HYSTERECTOMY  1997   uterine prolapse  . BILATERAL TOTAL MASTECTOMY WITH AXILLARY LYMPH NODE DISSECTION Bilateral 07/16/2014   Procedure: BILATERAL TOTAL  MASTECTOMY WITH RIGHT  AXILLARY SENTINEL NODE BIOPSY, PROPHYLATIC LEFT BREAST Mastectomy;  Surgeon: Excell Seltzer, MD;  Location: [facility];  Service: General;  Laterality: Bilateral;  . TOTAL MASTECTOMY Bilateral 07/16/2014   WITH SENTINAL NODE BIOPSY  . TUBAL LIGATION  1994  . VEIN LIGATION AND STRIPPING      FAMILY HISTORY Family History  Problem Relation Age of Onset  . Breast cancer Paternal Aunt 81  . Ovarian cancer Paternal Grandmother 24  . Lung cancer Paternal Grandfather 19  . Breast cancer Paternal Aunt 11       reportedly BRCA negative.   . Depression Mother   . Pulmonary fibrosis Father   . Diabetes Father    The patient's father died from complications of pulmonary fibrosis at the age of 8. The patient's mother is living, age 5. The patient had one brother, no sisters. The patient's paternal grandmother had ovarian cancer. The patient's father had 3 sisters. 2 of them have been diagnosed with breast cancer, one in her 7s one in her 7s. The one in her 53s was tested for the BRCA genes and was negative.  GYNECOLOGIC HISTORY:  No LMP recorded. Patient has had a hysterectomy. Menarche age 5, first live birth age 75. The patient is GX P2. She had a hysterectomy in 1997. She is not on hormone replacement.  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 She is divorced. The patient's son Maridee Slape is currently 53 and graduated from [employer] in math.. He is currently living with the patient. The patient's other son died at the age of 59. The patient has no grandchildren. She is not a Ambulance person.    ADVANCED DIRECTIVES: Not in place. At her 06/16/2014 visit the patient was given the appropriate documents to complete. She is considering naming her brother as her healthcare power of attorney.   HEALTH MAINTENANCE: Social History  Substance Use Topics  . Smoking status: Never Smoker  . Smokeless tobacco: Never Used  . Alcohol use Yes     Comment: not lately     Colonoscopy:  PAP:  Bone density:  Lipid panel:  Allergies  Allergen Reactions  . Demerol [Meperidine]     seizures  . Other Itching    Melon    No current outpatient prescriptions on file.   No current facility-administered medications for this visit.     OBJECTIVE: Middle-aged white woman In no acute distress Vitals:   05/20/17 0852  BP: 110/68  Pulse: 65  Resp: 18  Temp: 98.2 F (  36.8 C)  SpO2: 100%     Body mass index is 26.51 kg/m.    ECOG FS:0 - Asymptomatic  Sclerae unicteric, EOMs intact Oropharynx clear and moist No cervical or supraclavicular adenopathy Lungs no rales or rhonchi Heart regular rate and rhythm Abd soft, nontender, positive bowel sounds MSK no focal spinal tenderness, no upper extremity lymphedema Neuro: nonfocal, well oriented, appropriate affect Breasts: Status post bilateral mastectomies. No evidence of chest wall recurrence. Both axillae are benign   LAB RESULTS:  CMP     Component Value Date/Time   NA 141 02/20/2017 0827   K 4.6 02/20/2017 0827   CL 104 07/17/2014 0524   CO2 24 02/20/2017 0827   GLUCOSE 99 02/20/2017 0827   BUN 12.4 02/20/2017 0827   CREATININE 0.8 02/20/2017 0827   CALCIUM 9.1 02/20/2017 0827   PROT 7.5 02/20/2017 0827   ALBUMIN 4.0 02/20/2017 0827   AST 16  02/20/2017 0827   ALT 17 02/20/2017 0827   ALKPHOS 66 02/20/2017 0827   BILITOT 0.58 02/20/2017 0827   GFRNONAA >90 07/17/2014 0524   GFRAA >90 07/17/2014 0524    I No results found for: SPEP  Lab Results  Component Value Date   WBC 3.6 (L) 05/20/2017   NEUTROABS 1.9 05/20/2017   HGB 14.4 05/20/2017   HCT 41.8 05/20/2017   MCV 90.5 05/20/2017   PLT 193 05/20/2017      Chemistry      Component Value Date/Time   NA 141 02/20/2017 0827   K 4.6 02/20/2017 0827   CL 104 07/17/2014 0524   CO2 24 02/20/2017 0827   BUN 12.4 02/20/2017 0827   CREATININE 0.8 02/20/2017 0827      Component Value Date/Time   CALCIUM 9.1 02/20/2017 0827   ALKPHOS 66 02/20/2017 0827   AST 16 02/20/2017 0827   ALT 17 02/20/2017 0827   BILITOT 0.58 02/20/2017 0827       No results found for: LABCA2  No components found for: LABCA125  No results for input(s): INR in the last 168 hours.  Urinalysis No results found for: COLORURINE   STUDIES: No results found.  ASSESSMENT: 46 y.o. BRCA negative [place] woman status post right breast upper-outer quadrant biopsy 06/04/2014 for a clinical T3 N0-1, stage II/III invasive ductal carcinoma, grade 2, estrogen receptor 91% positive, progesterone receptor 58% positive, with an MIB-1 of 9%, and no HER-2 amplification  (1) status post right mastectomy with sentinel lymph node sampling 07/16/2014 for an mpT3 pN0, stage IIA invasive ductal carcinoma, grade 2, with repeat HER-2 again negative. Margins were ample. Simple left mastectomy showed no evidence of malignancy  (2) Oncotype score of 16 predicts a risk of recurrence outside the breast over the next 10 years of 10% if the patient's only systemic therapy is tamoxifen for 5 years. It also predicts no benefit from chemotherapy.  (3) postmastectomy radiation completed 11/05/2014: Right breast / 50.4 Gray @ 1.8 Pearline Cables per fraction x 28 fractions  (4) tamoxifen prescribed 12/31/2014, never started by  patient  (5) the patient is not planning on reconstruction  (6) OvaNext gene panel through [facility] reported October 2015, with no deleterious mutations.  PLAN: Taylor Perez is now just about 3 years out from definitive surgery for her breast cancer with no evidence of disease recurrence. This is very favorable.  She is very consistent in her desire not to be treated with anti-estrogens. Accordingly the plan from here on his observation.  We discussed her headaches. She had  similar headaches in 2015. MRI of the brain at that time was normal. I think her headaches now are per tension headaches in part sinus headaches. Nevertheless I offered to get her CT of the brain. She is not interested in pursuing that at this point.  She is hoping to move to [facility]. I have urged her to find herself a primary care physician as soon as she does. I gave her my card so that physician can contact us for background information on her  I am making her a return appointment in one year but she very likely will be already moved by then.  I will be glad to see her at any point if I can be of any help to her in the future.   Chauncey Cruel, MD   05/20/2017 9:04 AM

## 2017-05-21 LAB — FOLLICLE STIMULATING HORMONE: FSH: 12.4 m[IU]/mL

## 2017-05-22 LAB — ESTRADIOL, ULTRA SENS: Estradiol, Sensitive: 167.9 pg/mL

## 2018-05-19 NOTE — Progress Notes (Signed)
[place]  [phone] [phone]     ID: Taylor Perez DOB: [birth year]  MR#: [id number]  KLK#:[id number]  Patient Care Team: Kathyrn Lass, MD as PCP - General (Family Medicine) Excell Seltzer, MD as Consulting Physician (General Surgery) Emlyn Maves, Virgie Dad, MD as Consulting Physician (Oncology) Thea Silversmith, MD as Consulting Physician (Radiation Oncology)  OTHER MD: Dr Ann Held [facility])  CHIEF COMPLAINT: Estrogen receptor positive breast cancer  CURRENT TREATMENT: Observation   BREAST CANCER HISTORY: From the original intake note:  "Taylor Perez" noted a change in her right breast sometime around February or March 2015. She waited to see whether it would resolve, but as it did not she eventually brought it to the attention of Dr. Sabra Heck, who confirmed the finding and set up the patient for bilateral diagnostic mammography and right breast ultrasonography at the breast Center 05/28/2014. Breast density was category D. There was an irregular mass with spiculations in the right breast upper-outer quadrant which was palpable and described as hard. Ultrasound confirmed an irregular spiculated hypoechoic mass occupying the entire upper outer quadrant of the right breast and measuring at least 4.5 cm. In the right axilla there was a lymph node which was normal in size but showed cortical nodularity and abnormal peripheral hypervascularity.  Accordingly on 06/04/2014 the patient underwent biopsy of the breast mass (the lymph node described from the prior ultrasound was found to have a large fatty hilum and symmetric cortex but a moderate size artery and vein draped over it and therefore it was not biopsied). The pathology from this procedure showed ([id number] [id number]) and invasive ductal carcinoma, E-cadherin positive, estrogen receptor 91% positive, progesterone receptor 58% positive, both with moderate staining intensity, with an MIB-1 of 9%, and no  HER-2 amplification, the signals ratio being 1.17 and the number per cell 1.70.  On 06/11/2014 the patient underwent bilateral breast MRI. This showed an area of architectural distortion an irregular clumped enhancement extending for a total of 7.4 cm in the right breast. He comes close to the skin of the anterior lateral right breast but no definite abnormal skin enhancement was noted. There were no findings suggestive of nipple involvement and no findings of the pectoralis involvement. The left breast and axillary lymph nodes are unremarkable. However there was a nonenhancing T2 increased signal along the left aspect of the lower thoracic vertebral body which was felt to require further evaluation.  The patient's subsequent history is as detailed below  INTERVAL HISTORY: Taylor Perez returns today for follow-up of her estrogen receptor positive breast cancer. She continues under observation.   History is generally stable.  She is still in [place] and hoping to move to a better position.  Her son is still living with her at home.  She tells me he is learning to drive which will facilitate his getting a job.  REVIEW OF SYSTEMS: Taylor Perez reports that she had right knee surgery at [facility] orthopedic. She has pain and difficulties when she hikes, but no trouble walking. She does walking and rides the stationary bike for exercise. She had a sinus infection recently and took antibiotics. She denies unusual headaches, visual changes, nausea, vomiting, or dizziness. There has been no unusual cough, phlegm production, or pleurisy. There has been no change in bowel or bladder habits. She denies unexplained fatigue or unexplained weight loss, bleeding, rash, or fever. A detailed review of systems was otherwise stable.    PAST MEDICAL HISTORY: Past Medical History:  Diagnosis Date  . Anxiety   .  Arthritis    right arm and shoulder  . Asthma   . Breast cancer ([place])   . Cancer (HCC)    breast  .  Hashimoto's thyroiditis   . Headache    sinus  . Other isolated or specific phobias   . Peripheral vascular disease (HCC)    varicose veins  . Pneumonia 2006  . Stroke [facility]) 2008   mini stroke  . Vitamin D deficiency     PAST SURGICAL HISTORY: Past Surgical History:  Procedure Laterality Date  . ABDOMINAL HYSTERECTOMY  1997   uterine prolapse  . BILATERAL TOTAL MASTECTOMY WITH AXILLARY LYMPH NODE DISSECTION Bilateral 07/16/2014   Procedure: BILATERAL TOTAL  MASTECTOMY WITH RIGHT  AXILLARY SENTINEL NODE BIOPSY, PROPHYLATIC LEFT BREAST Mastectomy;  Surgeon: Excell Seltzer, MD;  Location: [place];  Service: General;  Laterality: Bilateral;  . TOTAL MASTECTOMY Bilateral 07/16/2014   WITH SENTINAL NODE BIOPSY  . TUBAL LIGATION  1994  . VEIN LIGATION AND STRIPPING      FAMILY HISTORY Family History  Problem Relation Age of Onset  . Breast cancer Paternal Aunt 50  . Ovarian cancer Paternal Grandmother 71  . Lung cancer Paternal Grandfather 57  . Breast cancer Paternal Aunt 55       reportedly BRCA negative.   . Depression Mother   . Pulmonary fibrosis Father   . Diabetes Father    The patient's father died from complications of pulmonary fibrosis at the age of 15. The patient's mother is living, age 52. The patient had one brother, no sisters. The patient's paternal grandmother had ovarian cancer. The patient's father had 3 sisters. 2 of them have been diagnosed with breast cancer, one in her 35s one in her 64s. The one in her 61s was tested for the BRCA genes and was negative.  GYNECOLOGIC HISTORY:  No LMP recorded. Patient has had a hysterectomy. Menarche age 33, first live birth age 50. The patient is GX P2. She had a hysterectomy in 1997. She is not on hormone replacement.  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 She left that job for a similar job in [place] in 2016. She is divorced. The  patient's son Kamari Bilek graduated from [employer] in math. He is currently living with the patient. The patient's other son died at the age of 106. The patient has no grandchildren. She is not a Ambulance person.    ADVANCED DIRECTIVES: Not in place. At her 06/16/2014 visit the patient was given the appropriate documents to complete. She is considering naming her brother as her healthcare power of attorney.   HEALTH MAINTENANCE: Social History   Tobacco Use  . Smoking status: Never Smoker  . Smokeless tobacco: Never Used  Substance Use Topics  . Alcohol use: Yes    Comment: not lately  . Drug use: No     Colonoscopy:  PAP:  Bone density:  Lipid panel:  Allergies  Allergen Reactions  . Demerol [Meperidine]     seizures  . Other Itching    Melon  Current medications include montelukast 10 mg at bedtime quetiapine ER 50 mg at bedtime, bupropion XL 30 mg daily, and currently amoxicillin clavulanate every 12 hours for sinus problems  No current outpatient medications on file.   No current facility-administered medications for this visit.     OBJECTIVE: Middle-aged white woman who appears stated age  47:   05/20/18 1248  BP: 128/76  Pulse: 84  Resp: 18  Temp: 98.7 F (37.1 C)  SpO2: 99%     Body mass index is 27.77 kg/m.    ECOG FS:1 - Symptomatic but completely ambulatory  Sclerae unicteric, pupils round and equal Oropharynx clear and moist No cervical or supraclavicular adenopathy Lungs no rales or rhonchi Heart regular rate and rhythm Abd soft, nontender, positive bowel sounds MSK no focal spinal tenderness, no upper extremity lymphedema Neuro: nonfocal, well oriented, appropriate affect Breasts: Status post bilateral mastectomies.  There is no evidence of chest wall recurrence.  Both axillae are benign.  LAB RESULTS:  CMP     Component Value Date/Time   NA 139 05/20/2017 0845   K 4.4 05/20/2017 0845   CL 104 07/17/2014 0524   CO2 26 05/20/2017 0845     GLUCOSE 86 05/20/2017 0845   BUN 9.5 05/20/2017 0845   CREATININE 0.9 05/20/2017 0845   CALCIUM 9.1 05/20/2017 0845   PROT 7.3 05/20/2017 0845   ALBUMIN 3.7 05/20/2017 0845   AST 16 05/20/2017 0845   ALT 15 05/20/2017 0845   ALKPHOS 64 05/20/2017 0845   BILITOT 0.44 05/20/2017 0845   GFRNONAA >90 07/17/2014 0524   GFRAA >90 07/17/2014 0524    I No results found for: SPEP  Lab Results  Component Value Date   WBC 3.6 (L) 05/20/2017   NEUTROABS 1.9 05/20/2017   HGB 14.4 05/20/2017   HCT 41.8 05/20/2017   MCV 90.5 05/20/2017   PLT 193 05/20/2017      Chemistry      Component Value Date/Time   NA 139 05/20/2017 0845   K 4.4 05/20/2017 0845   CL 104 07/17/2014 0524   CO2 26 05/20/2017 0845   BUN 9.5 05/20/2017 0845   CREATININE 0.9 05/20/2017 0845      Component Value Date/Time   CALCIUM 9.1 05/20/2017 0845   ALKPHOS 64 05/20/2017 0845   AST 16 05/20/2017 0845   ALT 15 05/20/2017 0845   BILITOT 0.44 05/20/2017 0845       No results found for: LABCA2  No components found for: LABCA125  No results for input(s): INR in the last 168 hours.  Urinalysis No results found for: COLORURINE   STUDIES: No results found.  ASSESSMENT: 47 y.o. BRCA negative [place] woman status post right breast upper-outer quadrant biopsy 06/04/2014 for a clinical T3 N0-1, stage II/III invasive ductal carcinoma, grade 2, estrogen receptor 91% positive, progesterone receptor 58% positive, with an MIB-1 of 9%, and no HER-2 amplification  (1) status post right mastectomy with sentinel lymph node sampling 07/16/2014 for an mpT3 pN0, stage IIA invasive ductal carcinoma, grade 2, with repeat HER-2 again negative. Margins were ample. Simple left mastectomy showed no evidence of malignancy  (2) Oncotype score of 16 predicts a risk of recurrence outside the breast over the next 10 years of 10% if the patient's only systemic therapy is tamoxifen for 5 years. It also predicts no benefit from  chemotherapy.  (3) postmastectomy radiation completed 11/05/2014: Right breast / 50.4 Gray @ 1.8 Pearline Cables per fraction x 28 fractions  (4) tamoxifen prescribed 12/31/2014, never started by patient  (5) the patient is not planning on reconstruction  (6) OvaNext gene panel through [facility] reported October 2015, with no deleterious mutations.  PLAN: Taylor Perez is now just about 4 years out from definitive surgery for her breast cancer with no evidence of disease recurrence.  This is very favorable.  She continues to have a difficult social situation.  She is getting some counseling which is helpful.  Hopefully she will be able to relocate at her discretion to an area where she will feel more comfortable.  I am going to see her one last time October 2020 and likely at that time she will be released from follow-up  She knows to call for any other issues that may develop before then.   Cam Harnden, Virgie Dad, MD  05/20/18 1:18 PM Medical Oncology and Hematology [facility] [address] [place], [place] [address] Tel. [phone]    Fax. [phone]  Alice Rieger, am acting as scribe for Chauncey Cruel MD.  I, Lurline Del MD, have reviewed the above documentation for accuracy and completeness, and I agree with the above.

## 2018-05-20 ENCOUNTER — Inpatient Hospital Stay: Payer: BLUE CROSS/BLUE SHIELD | Attending: Oncology | Admitting: Oncology

## 2018-05-20 ENCOUNTER — Telehealth: Payer: Self-pay | Admitting: Oncology

## 2018-05-20 VITALS — BP 128/76 | HR 84 | Temp 98.7°F | Resp 18 | Ht 65.0 in | Wt 166.9 lb

## 2018-05-20 DIAGNOSIS — Z170 Estrogen receptor positive status [ER+]: Secondary | ICD-10-CM | POA: Diagnosis not present

## 2018-05-20 DIAGNOSIS — C50411 Malignant neoplasm of upper-outer quadrant of right female breast: Secondary | ICD-10-CM | POA: Diagnosis not present

## 2018-05-20 NOTE — Telephone Encounter (Signed)
Per 8/20 patient decline avs and calendar

## 2019-06-29 ENCOUNTER — Other Ambulatory Visit: Payer: Self-pay | Admitting: Oncology

## 2019-07-01 ENCOUNTER — Telehealth: Payer: Self-pay | Admitting: Oncology

## 2019-07-01 NOTE — Telephone Encounter (Signed)
GM CME moved appointments from 10/20 to 11/12 w/LC. Confirmed with patient.

## 2019-07-21 ENCOUNTER — Ambulatory Visit: Payer: BLUE CROSS/BLUE SHIELD | Admitting: Oncology

## 2019-08-13 ENCOUNTER — Other Ambulatory Visit: Payer: Self-pay

## 2019-08-13 ENCOUNTER — Inpatient Hospital Stay: Payer: BC Managed Care – PPO | Attending: Oncology | Admitting: Adult Health

## 2019-08-13 ENCOUNTER — Encounter: Payer: Self-pay | Admitting: Adult Health

## 2019-08-13 VITALS — BP 139/88 | HR 90 | Temp 98.5°F | Resp 18 | Ht 65.0 in | Wt 169.7 lb

## 2019-08-13 DIAGNOSIS — C50411 Malignant neoplasm of upper-outer quadrant of right female breast: Secondary | ICD-10-CM

## 2019-08-13 DIAGNOSIS — Z170 Estrogen receptor positive status [ER+]: Secondary | ICD-10-CM | POA: Diagnosis not present

## 2019-08-13 DIAGNOSIS — Z853 Personal history of malignant neoplasm of breast: Secondary | ICD-10-CM | POA: Diagnosis not present

## 2019-08-13 NOTE — Progress Notes (Signed)
[place]  Telephone:(336) [phone] Fax:(336) [phone]     ID: Taylor Perez DOB: Jul 01, 1971  MR#: [id number]  KGM#:[id number]  Patient Care Team: Patient, No Pcp Per as PCP - General ([place]) Excell Seltzer, MD as Consulting Physician (General Surgery) Magrinat, Virgie Dad, MD as Consulting Physician (Oncology) Thea Silversmith, MD as Consulting Physician (Radiation Oncology)  OTHER MD: Dr Ann Held [facility])  CHIEF COMPLAINT: Estrogen receptor positive breast cancer  CURRENT TREATMENT: Observation   BREAST CANCER HISTORY: From the original intake note:  "Taylor Perez" noted a change in her right breast sometime around February or March 2015. She waited to see whether it would resolve, but as it did not she eventually brought it to the attention of Dr. Sabra Heck, who confirmed the finding and set up the patient for bilateral diagnostic mammography and right breast ultrasonography at the breast Center 05/28/2014. Breast density was category D. There was an irregular mass with spiculations in the right breast upper-outer quadrant which was palpable and described as hard. Ultrasound confirmed an irregular spiculated hypoechoic mass occupying the entire upper outer quadrant of the right breast and measuring at least 4.5 cm. In the right axilla there was a lymph node which was normal in size but showed cortical nodularity and abnormal peripheral hypervascularity.  Accordingly on 06/04/2014 the patient underwent biopsy of the breast mass (the lymph node described from the prior ultrasound was found to have a large fatty hilum and symmetric cortex but a moderate size artery and vein draped over it and therefore it was not biopsied). The pathology from this procedure showed ([id number] [id number]) and invasive ductal carcinoma, E-cadherin positive, estrogen receptor 91% positive, progesterone receptor 58% positive, both with moderate staining intensity, with an MIB-1 of 9%, and no  HER-2 amplification, the signals ratio being 1.17 and the number per cell 1.70.  On 06/11/2014 the patient underwent bilateral breast MRI. This showed an area of architectural distortion an irregular clumped enhancement extending for a total of 7.4 cm in the right breast. He comes close to the skin of the anterior lateral right breast but no definite abnormal skin enhancement was noted. There were no findings suggestive of nipple involvement and no findings of the pectoralis involvement. The left breast and axillary lymph nodes are unremarkable. However there was a nonenhancing T2 increased signal along the left aspect of the lower thoracic vertebral body which was felt to require further evaluation.  The patient's subsequent history is as detailed below  INTERVAL HISTORY: Taylor Perez returns today for follow-up of her estrogen receptor positive breast cancer. She continues under observation.   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  Taylor Perez lives in [place] and sees her PCP regularly.  She is up to date with skin cancer screening.  She sees GYN regularly, s/p hysterectomy.  She has not yet had colonoscopy scheduled.    REVIEW OF SYSTEMS: Taylor Perez notes she had a vulvar growth and it was biopsied and benign, this happened about three months ago.  Taylor Perez is not exercising currently.  She says she sometimes with ride a stationary bike a couple of times a week.    Taylor Perez denies any fever, chills, chest pian, palpitations, cough, shortness of breath, nausea, vomiting,bowel/bladder changes, or any other issues.  A detailed ROS was otherwise non contributory.    PAST MEDICAL HISTORY: Past Medical History:  Diagnosis Date   Anxiety    Arthritis    right arm and shoulder   Asthma  Breast cancer ([place])    Cancer ([place])    breast   Hashimoto's thyroiditis    Headache    sinus   Other isolated or specific phobias    Peripheral vascular disease ([place])    varicose  veins   Pneumonia 2006   Stroke [facility]) 2008   mini stroke   Vitamin D deficiency     PAST SURGICAL HISTORY: Past Surgical History:  Procedure Laterality Date   ABDOMINAL HYSTERECTOMY  1997   uterine prolapse   BILATERAL TOTAL MASTECTOMY WITH AXILLARY LYMPH NODE DISSECTION Bilateral 07/16/2014   Procedure: BILATERAL TOTAL  MASTECTOMY WITH RIGHT  AXILLARY SENTINEL NODE BIOPSY, PROPHYLATIC LEFT BREAST Mastectomy;  Surgeon: Excell Seltzer, MD;  Location: [place];  Service: General;  Laterality: Bilateral;   TOTAL MASTECTOMY Bilateral 07/16/2014   WITH SENTINAL NODE BIOPSY   TUBAL LIGATION  1994   VEIN LIGATION AND STRIPPING      FAMILY HISTORY Family History  Problem Relation Age of Onset   Breast cancer Paternal Aunt 29   Ovarian cancer Paternal Grandmother 1   Lung cancer Paternal Grandfather 67   Breast cancer Paternal Aunt 75       reportedly BRCA negative.    Depression Mother    Pulmonary fibrosis Father    Diabetes Father    The patient's father died from complications of pulmonary fibrosis at the age of 61. The patient's mother is living, age 49. The patient had one brother, no sisters. The patient's paternal grandmother had ovarian cancer. The patient's father had 3 sisters. 2 of them have been diagnosed with breast cancer, one in her 90s one in her 35s. The one in her 50s was tested for the BRCA genes and was negative.  GYNECOLOGIC HISTORY:  No LMP recorded. Patient has had a hysterectomy. Menarche age 89, first live birth age 36. The patient is GX P2. She had a hysterectomy in 1997. She is not on hormone replacement.  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 She is divorced. The patient's son Joanne Salah graduated from [employer] in math. He is currently living with the patient. The patient's other son died at the age of  45. The patient has no grandchildren. She is not a Ambulance person.    ADVANCED DIRECTIVES: Not in place. At her 06/16/2014 visit the patient was given the appropriate documents to complete. She is considering naming her brother as her healthcare power of attorney.   HEALTH MAINTENANCE: Social History   Tobacco Use   Smoking status: Never Smoker   Smokeless tobacco: Never Used  Substance Use Topics   Alcohol use: Yes    Comment: not lately   Drug use: No     Colonoscopy:  PAP:  Bone density:  Lipid panel:  Allergies  Allergen Reactions   Demerol [Meperidine]     seizures   Other Itching    Melon  Current medications include montelukast 10 mg at bedtime quetiapine ER 50 mg at bedtime, bupropion XL 30 mg daily, and currently amoxicillin clavulanate every 12 hours for sinus problems  No current outpatient medications on file.   No current facility-administered medications for this visit.     OBJECTIVE:   Vitals:   08/13/19 1311  BP: 139/88  Pulse: 90  Resp: 18  Temp: 98.5 F (36.9 C)  SpO2: 100%     Body mass  index is 28.24 kg/m.    ECOG FS:1 - Symptomatic but completely ambulatory GENERAL: Patient is a well appearing female in no acute distress HEENT:  Sclerae anicteric.  Oropharynx clear and moist. No ulcerations or evidence of oropharyngeal candidiasis. Neck is supple.  NODES:  No cervical, supraclavicular, or axillary lymphadenopathy palpated.  BREAST EXAM:  S/p bilateral mastectomies, no sign of local recurrence LUNGS:  Clear to auscultation bilaterally.  No wheezes or rhonchi. HEART:  Regular rate and rhythm. No murmur appreciated. ABDOMEN:  Soft, nontender.  Positive, normoactive bowel sounds. No organomegaly palpated. MSK:  No focal spinal tenderness to palpation. Full range of motion bilaterally in the upper extremities. EXTREMITIES:  No peripheral edema.   SKIN:  Clear with no obvious rashes or skin changes. No nail dyscrasia. NEURO:  Nonfocal.  Well oriented.  Appropriate affect.   LAB RESULTS:  CMP     Component Value Date/Time   NA 139 05/20/2017 0845   K 4.4 05/20/2017 0845   CL 104 07/17/2014 0524   CO2 26 05/20/2017 0845   GLUCOSE 86 05/20/2017 0845   BUN 9.5 05/20/2017 0845   CREATININE 0.9 05/20/2017 0845   CALCIUM 9.1 05/20/2017 0845   PROT 7.3 05/20/2017 0845   ALBUMIN 3.7 05/20/2017 0845   AST 16 05/20/2017 0845   ALT 15 05/20/2017 0845   ALKPHOS 64 05/20/2017 0845   BILITOT 0.44 05/20/2017 0845   GFRNONAA >90 07/17/2014 0524   GFRAA >90 07/17/2014 0524    I No results found for: SPEP  Lab Results  Component Value Date   WBC 3.6 (L) 05/20/2017   NEUTROABS 1.9 05/20/2017   HGB 14.4 05/20/2017   HCT 41.8 05/20/2017   MCV 90.5 05/20/2017   PLT 193 05/20/2017      Chemistry      Component Value Date/Time   NA 139 05/20/2017 0845   K 4.4 05/20/2017 0845   CL 104 07/17/2014 0524   CO2 26 05/20/2017 0845   BUN 9.5 05/20/2017 0845   CREATININE 0.9 05/20/2017 0845      Component Value Date/Time   CALCIUM 9.1 05/20/2017 0845   ALKPHOS 64 05/20/2017 0845   AST 16 05/20/2017 0845   ALT 15 05/20/2017 0845   BILITOT 0.44 05/20/2017 0845       No results found for: LABCA2  No components found for: LABCA125  No results for input(s): INR in the last 168 hours.  Urinalysis No results found for: COLORURINE   STUDIES: No results found.  ASSESSMENT: 48 y.o. BRCA negative [place] woman status post right breast upper-outer quadrant biopsy 06/04/2014 for a clinical T3 N0-1, stage II/III invasive ductal carcinoma, grade 2, estrogen receptor 91% positive, progesterone receptor 58% positive, with an MIB-1 of 9%, and no HER-2 amplification  (1) status post right mastectomy with sentinel lymph node sampling 07/16/2014 for an mpT3 pN0, stage IIA invasive ductal carcinoma, grade 2, with repeat HER-2 again negative. Margins were ample. Simple left mastectomy showed no evidence of malignancy  (2)  Oncotype score of 16 predicts a risk of recurrence outside the breast over the next 10 years of 10% if the patient's only systemic therapy is tamoxifen for 5 years. It also predicts no benefit from chemotherapy.  (3) postmastectomy radiation completed 11/05/2014: Right breast / 50.4 Gray @ 1.8 Pearline Cables per fraction x 28 fractions  (4) tamoxifen prescribed 12/31/2014, never started by patient  (5) the patient is not planning on reconstruction  (6) OvaNext gene panel through [facility] [facility] reported October  2015, with no deleterious mutations.  PLAN:  Taylor Perez is doing well today.  She has no clinical signs of breast cancer recurrence, which is very positive.  She is doing well.  She will graduate today from follow up, and she will be undergoing breast exam with gyn annually.  Taylor Perez and I reviewed our recommendation for staying up to date with PCP visits, including skin cancer and colon cancer screening.  She will see GYN regularly, however is s/p TAH.    I recommended healthy diet and exercise 45 minutes most days of the week.  We will see Taylor Perez back on an as needed basis, but she knows that if she every has any question or concern to pick up the phone and call us.  She was recommended to continue with the appropriate pandemic precautions.   A total of (20) minutes of face-to-face time was spent with this patient with greater than 50% of that time in counseling and care-coordination.   Wilber Bihari, NP  08/13/19 1:33 PM Medical Oncology and Hematology [facility] [address] [place], [place] [address] Tel. [phone]    Fax. [phone]
